# Patient Record
Sex: Female | Born: 1990
Health system: Southern US, Community
[De-identification: ages and names within clinical notes are randomized; demographics above are authoritative.]

## PROBLEM LIST (undated history)

## (undated) DIAGNOSIS — F419 Anxiety disorder, unspecified: Secondary | ICD-10-CM

## (undated) DIAGNOSIS — Z8619 Personal history of other infectious and parasitic diseases: Secondary | ICD-10-CM

## (undated) DIAGNOSIS — R51 Headache: Secondary | ICD-10-CM

## (undated) DIAGNOSIS — F329 Major depressive disorder, single episode, unspecified: Secondary | ICD-10-CM

## (undated) DIAGNOSIS — M549 Dorsalgia, unspecified: Secondary | ICD-10-CM

## (undated) DIAGNOSIS — G43909 Migraine, unspecified, not intractable, without status migrainosus: Secondary | ICD-10-CM

## (undated) DIAGNOSIS — M2550 Pain in unspecified joint: Secondary | ICD-10-CM

## (undated) DIAGNOSIS — R5383 Other fatigue: Secondary | ICD-10-CM

## (undated) DIAGNOSIS — R519 Headache, unspecified: Secondary | ICD-10-CM

## (undated) DIAGNOSIS — N289 Disorder of kidney and ureter, unspecified: Secondary | ICD-10-CM

## (undated) DIAGNOSIS — F32A Depression, unspecified: Secondary | ICD-10-CM

## (undated) HISTORY — DX: Dorsalgia, unspecified: M54.9

## (undated) HISTORY — DX: Headache, unspecified: R51.9

## (undated) HISTORY — PX: MOLE REMOVAL: SHX2046

## (undated) HISTORY — DX: Headache: R51

## (undated) HISTORY — DX: Other fatigue: R53.83

## (undated) HISTORY — DX: Anxiety disorder, unspecified: F41.9

## (undated) HISTORY — PX: TONSILLECTOMY: SUR1361

## (undated) HISTORY — DX: Depression, unspecified: F32.A

## (undated) HISTORY — DX: Pain in unspecified joint: M25.50

## (undated) HISTORY — DX: Disorder of kidney and ureter, unspecified: N28.9

## (undated) HISTORY — PX: KIDNEY STONE SURGERY: SHX686

## (undated) HISTORY — DX: Personal history of other infectious and parasitic diseases: Z86.19

---
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
## 2015-09-29 LAB — OB RESULTS CONSOLE ABO/RH: RH TYPE: POSITIVE

## 2015-09-29 LAB — OB RESULTS CONSOLE HIV ANTIBODY (ROUTINE TESTING): HIV: NONREACTIVE

## 2015-09-29 LAB — OB RESULTS CONSOLE HEPATITIS B SURFACE ANTIGEN: HEP B S AG: NEGATIVE

## 2015-09-29 LAB — OB RESULTS CONSOLE ANTIBODY SCREEN: Antibody Screen: NEGATIVE

## 2015-09-29 LAB — OB RESULTS CONSOLE RUBELLA ANTIBODY, IGM: RUBELLA: IMMUNE

## 2015-10-16 LAB — OB RESULTS CONSOLE GC/CHLAMYDIA
Chlamydia: NEGATIVE
Gonorrhea: NEGATIVE

## 2015-12-28 NOTE — L&D Delivery Note (Signed)
Delivery Note At 8:25 PM a viable female was delivered via Vaginal, Spontaneous Delivery (Presentation: ; Occiput Anterior).  APGAR: 8, 9; weight  .   Placenta status: Intact and malodorous Spontaneous.  Cord: 3 vessels with the following complications: chorioamnionitis  Cord pH: not indicated  Anesthesia: Epidural  Episiotomy: None Lacerations: Sulcus;Labial, 2nd degree perineal Suture Repair: 2.0 3.0 vicryl rapide, single figure of 8 of 4-0 to L labial extension of sulcal Est. Blood Loss (mL):  450  Mom to postpartum.  Baby to Couplet care / Skin to Skin.  Katie Faraone A. 05/13/2016, 9:01 PM

## 2016-02-17 LAB — OB RESULTS CONSOLE RPR: RPR: NONREACTIVE

## 2016-04-16 DIAGNOSIS — Z3403 Encounter for supervision of normal first pregnancy, third trimester: Secondary | ICD-10-CM | POA: Diagnosis not present

## 2016-04-16 DIAGNOSIS — Z36 Encounter for antenatal screening of mother: Secondary | ICD-10-CM | POA: Diagnosis not present

## 2016-04-16 DIAGNOSIS — Z3A36 36 weeks gestation of pregnancy: Secondary | ICD-10-CM | POA: Diagnosis not present

## 2016-04-16 DIAGNOSIS — O3663X0 Maternal care for excessive fetal growth, third trimester, not applicable or unspecified: Secondary | ICD-10-CM | POA: Diagnosis not present

## 2016-04-16 LAB — OB RESULTS CONSOLE GBS: GBS: NEGATIVE

## 2016-05-11 ENCOUNTER — Encounter (HOSPITAL_COMMUNITY): Payer: Self-pay | Admitting: *Deleted

## 2016-05-11 ENCOUNTER — Telehealth (HOSPITAL_COMMUNITY): Payer: Self-pay | Admitting: *Deleted

## 2016-05-11 NOTE — Telephone Encounter (Signed)
Preadmission screen  

## 2016-05-12 ENCOUNTER — Encounter (HOSPITAL_COMMUNITY): Payer: Self-pay | Admitting: *Deleted

## 2016-05-12 ENCOUNTER — Inpatient Hospital Stay (HOSPITAL_COMMUNITY)
Admission: AD | Admit: 2016-05-12 | Discharge: 2016-05-15 | DRG: 775 | Disposition: A | Discharge status: Home / Self Care | Payer: BLUE CROSS/BLUE SHIELD | Source: Ambulatory Visit | Attending: Obstetrics & Gynecology | Admitting: Obstetrics & Gynecology

## 2016-05-12 ENCOUNTER — Other Ambulatory Visit: Payer: Self-pay | Admitting: Obstetrics

## 2016-05-12 DIAGNOSIS — O4202 Full-term premature rupture of membranes, onset of labor within 24 hours of rupture: Secondary | ICD-10-CM | POA: Diagnosis not present

## 2016-05-12 DIAGNOSIS — Z8249 Family history of ischemic heart disease and other diseases of the circulatory system: Secondary | ICD-10-CM | POA: Diagnosis not present

## 2016-05-12 DIAGNOSIS — O411230 Chorioamnionitis, third trimester, not applicable or unspecified: Secondary | ICD-10-CM | POA: Diagnosis not present

## 2016-05-12 DIAGNOSIS — O99214 Obesity complicating childbirth: Secondary | ICD-10-CM | POA: Diagnosis present

## 2016-05-12 DIAGNOSIS — E669 Obesity, unspecified: Secondary | ICD-10-CM | POA: Diagnosis present

## 2016-05-12 DIAGNOSIS — Z6835 Body mass index (BMI) 35.0-35.9, adult: Secondary | ICD-10-CM

## 2016-05-12 DIAGNOSIS — Z3A40 40 weeks gestation of pregnancy: Secondary | ICD-10-CM | POA: Diagnosis not present

## 2016-05-12 DIAGNOSIS — O4290 Premature rupture of membranes, unspecified as to length of time between rupture and onset of labor, unspecified weeks of gestation: Secondary | ICD-10-CM | POA: Diagnosis present

## 2016-05-12 DIAGNOSIS — O3663X0 Maternal care for excessive fetal growth, third trimester, not applicable or unspecified: Secondary | ICD-10-CM | POA: Diagnosis present

## 2016-05-12 DIAGNOSIS — Z833 Family history of diabetes mellitus: Secondary | ICD-10-CM

## 2016-05-12 DIAGNOSIS — Z23 Encounter for immunization: Secondary | ICD-10-CM | POA: Diagnosis not present

## 2016-05-12 LAB — POCT FERN TEST: POCT Fern Test: POSITIVE

## 2016-05-12 MED ORDER — LIDOCAINE HCL (PF) 1 % IJ SOLN
30.0000 mL | INTRAMUSCULAR | Status: DC | PRN
  Filled 2016-05-12: qty 30

## 2016-05-12 MED ORDER — OXYCODONE-ACETAMINOPHEN 5-325 MG PO TABS: 2.0000 | ORAL_TABLET | ORAL | Status: DC | PRN

## 2016-05-12 MED ORDER — CITRIC ACID-SODIUM CITRATE 334-500 MG/5ML PO SOLN: 30.0000 mL | ORAL | Status: DC | PRN

## 2016-05-12 MED ORDER — ACETAMINOPHEN 325 MG PO TABS: 650.0000 mg | ORAL_TABLET | ORAL | Status: DC | PRN

## 2016-05-12 MED ORDER — LACTATED RINGERS IV SOLN
INTRAVENOUS | Status: DC
  Administered 2016-05-13 (×3): via INTRAVENOUS

## 2016-05-12 MED ORDER — OXYCODONE-ACETAMINOPHEN 5-325 MG PO TABS: 1.0000 | ORAL_TABLET | ORAL | Status: DC | PRN

## 2016-05-12 MED ORDER — OXYTOCIN BOLUS FROM INFUSION
500.0000 mL | INTRAVENOUS | Status: DC
  Administered 2016-05-13: 500 mL via INTRAVENOUS

## 2016-05-12 MED ORDER — LACTATED RINGERS IV SOLN
500.0000 mL | INTRAVENOUS | Status: DC | PRN
  Administered 2016-05-13 (×2): 500 mL via INTRAVENOUS

## 2016-05-12 MED ORDER — OXYTOCIN 40 UNITS IN LACTATED RINGERS INFUSION - SIMPLE MED: 2.5000 [IU]/h | INTRAVENOUS | Status: DC

## 2016-05-12 MED ORDER — ONDANSETRON HCL 4 MG/2ML IJ SOLN: 4.0000 mg | Freq: Four times a day (QID) | INTRAMUSCULAR | Status: DC | PRN

## 2016-05-12 MED ORDER — FLEET ENEMA 7-19 GM/118ML RE ENEM: 1.0000 | ENEMA | RECTAL | Status: DC | PRN

## 2016-05-12 NOTE — MAU Note (Signed)
Pt states she was seen in the MD's office this am and has been contracting consistently since noon. Now contractions are about 3 minutes.

## 2016-05-12 NOTE — Progress Notes (Signed)
Notified of pt SROM and positive fern. Will admit to labor and delivery.

## 2016-05-13 ENCOUNTER — Encounter (HOSPITAL_COMMUNITY): Payer: Self-pay

## 2016-05-13 ENCOUNTER — Inpatient Hospital Stay (HOSPITAL_COMMUNITY): Payer: BLUE CROSS/BLUE SHIELD | Admitting: Anesthesiology

## 2016-05-13 LAB — CBC
HCT: 40.1 % (ref 36.0–46.0)
HEMOGLOBIN: 13.7 g/dL (ref 12.0–15.0)
MCH: 30.9 pg (ref 26.0–34.0)
MCHC: 34.2 g/dL (ref 30.0–36.0)
MCV: 90.3 fL (ref 78.0–100.0)
PLATELETS: 184 10*3/uL (ref 150–400)
RBC: 4.44 MIL/uL (ref 3.87–5.11)
RDW: 13.1 % (ref 11.5–15.5)
WBC: 13.1 10*3/uL — ABNORMAL HIGH (ref 4.0–10.5)

## 2016-05-13 LAB — TYPE AND SCREEN
ABO/RH(D): O POS
Antibody Screen: NEGATIVE

## 2016-05-13 LAB — ABO/RH: ABO/RH(D): O POS

## 2016-05-13 MED ORDER — BUTORPHANOL TARTRATE 1 MG/ML IJ SOLN
1.0000 mg | INTRAMUSCULAR | Status: AC
  Administered 2016-05-13 (×2): 1 mg via INTRAVENOUS
  Filled 2016-05-13 (×2): qty 1

## 2016-05-13 MED ORDER — ONDANSETRON HCL 4 MG/2ML IJ SOLN: 4.0000 mg | INTRAMUSCULAR | Status: DC | PRN

## 2016-05-13 MED ORDER — PHENYLEPHRINE 40 MCG/ML (10ML) SYRINGE FOR IV PUSH (FOR BLOOD PRESSURE SUPPORT)
80.0000 ug | PREFILLED_SYRINGE | INTRAVENOUS | Status: DC | PRN
  Filled 2016-05-13: qty 5
  Filled 2016-05-13: qty 10

## 2016-05-13 MED ORDER — BISACODYL 10 MG RE SUPP: 10.0000 mg | Freq: Every day | RECTAL | Status: DC | PRN

## 2016-05-13 MED ORDER — BUTORPHANOL TARTRATE 1 MG/ML IJ SOLN
1.0000 mg | Freq: Once | INTRAMUSCULAR | Status: AC
  Administered 2016-05-13: 1 mg via INTRAVENOUS
  Filled 2016-05-13: qty 1

## 2016-05-13 MED ORDER — TETANUS-DIPHTH-ACELL PERTUSSIS 5-2.5-18.5 LF-MCG/0.5 IM SUSP: 0.5000 mL | Freq: Once | INTRAMUSCULAR | Status: DC

## 2016-05-13 MED ORDER — DIBUCAINE 1 % RE OINT: 1.0000 "application " | TOPICAL_OINTMENT | RECTAL | Status: DC | PRN

## 2016-05-13 MED ORDER — SENNOSIDES-DOCUSATE SODIUM 8.6-50 MG PO TABS
2.0000 | ORAL_TABLET | ORAL | Status: DC
  Administered 2016-05-14 – 2016-05-15 (×2): 2 via ORAL
  Filled 2016-05-13 (×2): qty 2

## 2016-05-13 MED ORDER — TERBUTALINE SULFATE 1 MG/ML IJ SOLN
0.2500 mg | Freq: Once | INTRAMUSCULAR | Status: DC | PRN
  Filled 2016-05-13: qty 1

## 2016-05-13 MED ORDER — BENZOCAINE-MENTHOL 20-0.5 % EX AERO
1.0000 "application " | INHALATION_SPRAY | CUTANEOUS | Status: DC | PRN
  Administered 2016-05-14: 1 via TOPICAL
  Filled 2016-05-13: qty 56

## 2016-05-13 MED ORDER — NALBUPHINE HCL 10 MG/ML IJ SOLN
10.0000 mg | INTRAMUSCULAR | Status: DC | PRN
  Administered 2016-05-13: 10 mg via INTRAMUSCULAR
  Filled 2016-05-13: qty 1

## 2016-05-13 MED ORDER — GENTAMICIN SULFATE 40 MG/ML IJ SOLN
1.5000 mg/kg | INTRAVENOUS | Status: DC
  Filled 2016-05-13: qty 3

## 2016-05-13 MED ORDER — PRENATAL MULTIVITAMIN CH
1.0000 | ORAL_TABLET | Freq: Every day | ORAL | Status: DC
  Administered 2016-05-14 – 2016-05-15 (×2): 1 via ORAL
  Filled 2016-05-13 (×2): qty 1

## 2016-05-13 MED ORDER — EPHEDRINE 5 MG/ML INJ
10.0000 mg | INTRAVENOUS | Status: DC | PRN
  Filled 2016-05-13: qty 2

## 2016-05-13 MED ORDER — IBUPROFEN 600 MG PO TABS
600.0000 mg | ORAL_TABLET | Freq: Four times a day (QID) | ORAL | Status: DC
  Administered 2016-05-14 – 2016-05-15 (×7): 600 mg via ORAL
  Filled 2016-05-13 (×7): qty 1

## 2016-05-13 MED ORDER — ONDANSETRON HCL 4 MG PO TABS: 4.0000 mg | ORAL_TABLET | ORAL | Status: DC | PRN

## 2016-05-13 MED ORDER — SODIUM CHLORIDE 0.9 % IV SOLN
2.0000 g | Freq: Four times a day (QID) | INTRAVENOUS | Status: DC
  Administered 2016-05-13: 2 g via INTRAVENOUS
  Filled 2016-05-13 (×2): qty 2000

## 2016-05-13 MED ORDER — FENTANYL 2.5 MCG/ML BUPIVACAINE 1/10 % EPIDURAL INFUSION (WH - ANES)
14.0000 mL/h | INTRAMUSCULAR | Status: DC | PRN
  Administered 2016-05-13 (×2): 14 mL/h via EPIDURAL
  Filled 2016-05-13: qty 125

## 2016-05-13 MED ORDER — ZOLPIDEM TARTRATE 5 MG PO TABS: 5.0000 mg | ORAL_TABLET | Freq: Every evening | ORAL | Status: DC | PRN

## 2016-05-13 MED ORDER — PHENYLEPHRINE 40 MCG/ML (10ML) SYRINGE FOR IV PUSH (FOR BLOOD PRESSURE SUPPORT)
80.0000 ug | PREFILLED_SYRINGE | INTRAVENOUS | Status: DC | PRN
  Filled 2016-05-13: qty 5

## 2016-05-13 MED ORDER — COCONUT OIL OIL
1.0000 "application " | TOPICAL_OIL | Status: DC | PRN
  Administered 2016-05-14: 1 via TOPICAL
  Filled 2016-05-13: qty 120

## 2016-05-13 MED ORDER — DIPHENHYDRAMINE HCL 50 MG/ML IJ SOLN
12.5000 mg | INTRAMUSCULAR | Status: DC | PRN
Start: 2016-05-13 — End: 2016-05-13

## 2016-05-13 MED ORDER — LACTATED RINGERS IV SOLN
500.0000 mL | Freq: Once | INTRAVENOUS | Status: AC
  Administered 2016-05-13: 500 mL via INTRAVENOUS

## 2016-05-13 MED ORDER — GENTAMICIN SULFATE 40 MG/ML IJ SOLN
1.5000 mg/kg | Freq: Three times a day (TID) | INTRAVENOUS | Status: DC
  Administered 2016-05-13: 120 mg via INTRAVENOUS
  Filled 2016-05-13: qty 3

## 2016-05-13 MED ORDER — LIDOCAINE HCL (PF) 1 % IJ SOLN
INTRAMUSCULAR | Status: DC | PRN
  Administered 2016-05-13 (×2): 4 mL

## 2016-05-13 MED ORDER — NALBUPHINE HCL 10 MG/ML IJ SOLN
10.0000 mg | INTRAMUSCULAR | Status: DC | PRN
  Administered 2016-05-13: 10 mg via INTRAVENOUS
  Filled 2016-05-13: qty 1

## 2016-05-13 MED ORDER — ACETAMINOPHEN 325 MG PO TABS
650.0000 mg | ORAL_TABLET | ORAL | Status: DC | PRN
  Administered 2016-05-14 (×2): 650 mg via ORAL
  Filled 2016-05-13 (×2): qty 2

## 2016-05-13 MED ORDER — SIMETHICONE 80 MG PO CHEW
80.0000 mg | CHEWABLE_TABLET | ORAL | Status: DC | PRN
  Administered 2016-05-14: 80 mg via ORAL

## 2016-05-13 MED ORDER — DEXTROSE 5 % IV SOLN
1.5000 mg/kg | Freq: Three times a day (TID) | INTRAVENOUS | Status: DC
  Filled 2016-05-13: qty 3.75

## 2016-05-13 MED ORDER — WITCH HAZEL-GLYCERIN EX PADS
1.0000 "application " | MEDICATED_PAD | CUTANEOUS | Status: DC | PRN
  Administered 2016-05-14: 1 via TOPICAL

## 2016-05-13 MED ORDER — DIPHENHYDRAMINE HCL 25 MG PO CAPS: 25.0000 mg | ORAL_CAPSULE | Freq: Four times a day (QID) | ORAL | Status: DC | PRN

## 2016-05-13 MED ORDER — OXYTOCIN 40 UNITS IN LACTATED RINGERS INFUSION - SIMPLE MED
1.0000 m[IU]/min | INTRAVENOUS | Status: DC
  Administered 2016-05-13: 1 m[IU]/min via INTRAVENOUS
  Filled 2016-05-13: qty 1000

## 2016-05-13 MED ORDER — FLEET ENEMA 7-19 GM/118ML RE ENEM: 1.0000 | ENEMA | Freq: Every day | RECTAL | Status: DC | PRN

## 2016-05-13 NOTE — Anesthesia Preprocedure Evaluation (Addendum)
Anesthesia Evaluation  Patient identified by MRN, date of birth, ID band Patient awake    Reviewed: Allergy & Precautions, NPO status , Patient's Chart, lab work & pertinent test results  History of Anesthesia Complications Negative for: history of anesthetic complications  Airway Mallampati: II  TM Distance: >3 FB Neck ROM: Full    Dental no notable dental hx. (+) Dental Advisory Given   Pulmonary neg pulmonary ROS,    Pulmonary exam normal breath sounds clear to auscultation       Cardiovascular negative cardio ROS Normal cardiovascular exam Rhythm:Regular Rate:Normal     Neuro/Psych  Headaches, negative psych ROS   GI/Hepatic negative GI ROS, Neg liver ROS,   Endo/Other  obesity  Renal/GU negative Renal ROS  negative genitourinary   Musculoskeletal negative musculoskeletal ROS (+)   Abdominal   Peds negative pediatric ROS (+)  Hematology negative hematology ROS (+)   Anesthesia Other Findings   Reproductive/Obstetrics (+) Pregnancy                             Anesthesia Physical Anesthesia Plan  ASA: II  Anesthesia Plan: Epidural   Post-op Pain Management:    Induction:   Airway Management Planned:   Additional Equipment:   Intra-op Plan:   Post-operative Plan:   Informed Consent: I have reviewed the patients History and Physical, chart, labs and discussed the procedure including the risks, benefits and alternatives for the proposed anesthesia with the patient or authorized representative who has indicated his/her understanding and acceptance.   Dental advisory given  Plan Discussed with: CRNA  Anesthesia Plan Comments:         Anesthesia Quick Evaluation  

## 2016-05-13 NOTE — Progress Notes (Signed)
S: Doing well, no complaints, pain poorly controlled with IV meds, planning epidural now  O: BP 136/70 mmHg  Pulse 70  Temp(Src) 98.2 F (36.8 C) (Oral)  Resp 20  Ht 5\' 7"  (1.702 m)  Wt 102.967 kg (227 lb)  BMI 35.55 kg/m2  SpO2 99%  LMP 08/04/2015   FHT:  FHR: 140s bpm, variability: moderate,  accelerations:  Present,  decelerations:  Absent UC:   regular, every 4 minutes SVE:   Dilation: 4 Effacement (%): 70 Station: 0 Exam by:: J.Thornton, RN    A / P:  25 y.o.  Obstetric History   G1   P0   T0   P0   A0   TAB0   SAB0   E0   M0   L0    at 2344w3d Induction of labor due to PROM,  progressing well on pitocin  Fetal Wellbeing:  Category I Pain Control:  Epidural  Anticipated MOD:  NSVD  Carder Yin A. 05/13/2016, 1:10 PM

## 2016-05-13 NOTE — Progress Notes (Signed)
S: Doing well, no complaints, pain  controlled with epiduralt though feels intense pelvic pressure with contractions.  O: BP 140/82 mmHg  Pulse 127  Temp(Src) 98.9 F (37.2 C) (Oral)  Resp 18  Ht 5\' 7"  (1.702 m)  Wt 102.967 kg (227 lb)  BMI 35.55 kg/m2  SpO2 99%  LMP 08/04/2015   FHT:  FHR: 180s bpm, variability: moderate,  accelerations:  Present,  decelerations:  Absent UC:   regular, every 3 minutes SVE:   Dilation: 10 Effacement (%): 100 Station: +2 Exam by:: J.Thornton, RN  Unable to accurately tell position, suspect OP. No molding/ caput Thick meconium  A / P:  25 y.o.  Obstetric History   G1   P0   T0   P0   A0   TAB0   SAB0   E0   M0   L0    at 69108w3d good progress on pitocin after SROM, LGA, pushing x 1'45' min. continue pushing. No maternal fever by oral temp but fetal tachycardia, meconium and pt is palpably warm in vagina- suspect pending chorio and will start abx.  Fetal Wellbeing:  Category II Pain Control:  Epidural  Anticipated MOD:  NSVD  Kendra Baird A. 05/13/2016, 7:57 PM

## 2016-05-13 NOTE — H&P (Signed)
Kendra CellarMeghan Baird is a 25 y.o. female presenting for contractions and is noted to have ruptured membranes since around 3 pm. UCs getting stronger since admission. GBS(-).  PNCare uncomplicated, 38 lbs wt gain, EFW 7'6" at 89% and AC at 96 % at 36 wks but EFW at 77% today. QUAD, sono normal. SocHx,- her father died when she was 14 wks, and her husband had open heart surgery for aortic aneurysm at 32 weeks.   History OB History    Gravida Para Term Preterm AB TAB SAB Ectopic Multiple Living   1              Past Medical History  Diagnosis Date  . Hx of varicella   . Headache    Past Surgical History  Procedure Laterality Date  . Tonsillectomy     Family History: family history includes Cancer in her father and mother; Diabetes in her father; Heart disease in her paternal grandfather; Hypertension in her father; Varicose Veins in her brother and mother. Social History:  reports that she has never smoked. She does not have any smokeless tobacco history on file. She reports that she does not drink alcohol or use illicit drugs.   Prenatal Transfer Tool  Maternal Diabetes: No Genetic Screening: Normal QUAD Maternal Ultrasounds/Referrals: Normal Fetal Ultrasounds or other Referrals:  None Maternal Substance Abuse:  No Significant Maternal Medications:  None Significant Maternal Lab Results:  Lab values include: Group B Strep negative Other Comments:  None  ROS neg  Dilation: Fingertip Effacement (%): 50 Station: -3 Exam by:: C Brewer RN Blood pressure 148/83, pulse 86, temperature 98.2 F (36.8 C), temperature source Oral, resp. rate 18, height 5\' 7"  (1.702 m), weight 227 lb (102.967 kg), last menstrual period 08/04/2015, SpO2 99 %. Exam Physical Exam  Physical exam:  A&O x 3, no acute distress. Pleasant HEENT neg, no thyromegaly Lungs CTA bilat CV RRR, S1S2 normal Abdo soft, non tender, non acute Extr no edema/ tenderness Pelvic above FHT  150s/ + accels/ no decels/ mod variab-  category I Toco q 2-3 min, spontaneous   Prenatal labs: ABO, Rh: O/Positive/-- (10/03 0000) Antibody: Negative (10/03 0000) Rubella: Immune (10/03 0000) RPR: Nonreactive (02/21 0000)  HBsAg: Negative (10/03 0000)  HIV: Non-reactive (10/03 0000)  GBS: Negative (04/21 0000)   Assessment/Plan: 25 yo G1 at 40.3 wks, early labor, SROM. Add pitocin if UCs space out. GBS(-). EFW 8.1/2 lbs. Planning vaginal delivery.    Holmes Hays R 05/13/2016, 12:30 AM

## 2016-05-13 NOTE — Anesthesia Procedure Notes (Signed)
Epidural Patient location during procedure: OB  Staffing Anesthesiologist: Pura Picinich Performed by: anesthesiologist   Preanesthetic Checklist Completed: patient identified, site marked, surgical consent, pre-op evaluation, timeout performed, IV checked, risks and benefits discussed and monitors and equipment checked  Epidural Patient position: sitting Prep: site prepped and draped and DuraPrep Patient monitoring: continuous pulse ox and blood pressure Approach: midline Location: L3-L4 Injection technique: LOR saline  Needle:  Needle type: Tuohy  Needle gauge: 17 G Needle length: 9 cm and 9 Needle insertion depth: 6 cm Catheter type: closed end flexible Catheter size: 19 Gauge Catheter at skin depth: 10 cm Test dose: negative  Assessment Events: blood not aspirated, injection not painful, no injection resistance, negative IV test and no paresthesia  Additional Notes Patient identified. Risks/Benefits/Options discussed with patient including but not limited to bleeding, infection, nerve damage, paralysis, failed block, incomplete pain control, headache, blood pressure changes, nausea, vomiting, reactions to medication both or allergic, itching and postpartum back pain. Confirmed with bedside nurse the patient's most recent platelet count. Confirmed with patient that they are not currently taking any anticoagulation, have any bleeding history or any family history of bleeding disorders. Patient expressed understanding and wished to proceed. All questions were answered. Sterile technique was used throughout the entire procedure. Please see nursing notes for vital signs. Test dose was given through epidural catheter and negative prior to continuing to dose epidural or start infusion. Warning signs of high block given to the patient including shortness of breath, tingling/numbness in hands, complete motor block, or any concerning symptoms with instructions to call for help. Patient was  given instructions on fall risk and not to get out of bed. All questions and concerns addressed with instructions to call with any issues or inadequate analgesia.    

## 2016-05-13 NOTE — Anesthesia Pain Management Evaluation Note (Signed)
  CRNA Pain Management Visit Note  Patient: Kendra Baird, 25 y.o., female  "Hello I am a member of the anesthesia team at [facility]. We have an anesthesia team available at all times to provide care throughout the hospital, including epidural management and anesthesia for C-section. I don't know your plan for the delivery whether it a natural birth, water birth, IV sedation, nitrous supplementation, doula or epidural, but we want to meet your pain goals."   1.Was your pain managed to your expectations on prior hospitalizations?   No prior hospitalizations  2.What is your expectation for pain management during this hospitalization?     IV pain meds  3.How can we help you reach that goal? **informed of pain treatment options including epidural*  Record the patient's initial score and the patient's pain goal.   Pain: 7  Pain Goal: 9 [facility] wants you to be able to say your pain was always managed very well.  Edison PaceWILKERSON,Tallula Grindle 05/13/2016

## 2016-05-14 ENCOUNTER — Encounter (HOSPITAL_COMMUNITY): Payer: Self-pay | Admitting: *Deleted

## 2016-05-14 LAB — CBC
HEMATOCRIT: 33.8 % — AB (ref 36.0–46.0)
Hemoglobin: 11.5 g/dL — ABNORMAL LOW (ref 12.0–15.0)
MCH: 30.3 pg (ref 26.0–34.0)
MCHC: 34 g/dL (ref 30.0–36.0)
MCV: 89.2 fL (ref 78.0–100.0)
PLATELETS: 172 10*3/uL (ref 150–400)
RBC: 3.79 MIL/uL — AB (ref 3.87–5.11)
RDW: 13.2 % (ref 11.5–15.5)
WBC: 22.8 10*3/uL — ABNORMAL HIGH (ref 4.0–10.5)

## 2016-05-14 LAB — RPR: RPR Ser Ql: NONREACTIVE

## 2016-05-14 NOTE — Lactation Note (Signed)
This note was copied from a baby's chart. Lactation Consultation Note  Patient Name: Boy [place] CellarMeghan Colonna UEAVW'UToday's Date: 05/14/2016 Reason for consult: Initial assessment  Baby 18 hours old, and per mom and dad baby had meconium  Stool in amniotic fluid. One wet diaper. Baby has been to the breast several times and attempts.  Per mom was shown how to hand express by [facility].  Baby stirring when LC in the room , dad checked diaper and clean.  Dad placed baby skin to skin in cross cradle. Mom latched and LC eased down chin to obtain depth , upper lip  Flipped to flanged position. Intermittent swallows noted, increased with breast compressions.  LC reviewed hand expression with several drops before latch.  LC reviewed steps for latching - breast massage, hand express, breast compressions until the baby is swallowing  And mom comfortable with latch.  Mother informed of post-discharge support and given phone number to the lactation department, including services for  phone call assistance; out-patient appointments; and breastfeeding support group. List of other breastfeeding resources  in the community given in the handout. Encouraged mother to call for problems or concerns related to breastfeeding.   Maternal Data Has patient been taught Hand Expression?: Yes  Feeding Feeding Type: Breast Fed (laid back position ) Length of feed: 15 min (mutliply swallows, increased with breast compressions, )  LATCH Score/Interventions Latch: Grasps breast easily, tongue down, lips flanged, rhythmical sucking. Intervention(s): Adjust position;Assist with latch;Breast massage;Breast compression  Audible Swallowing: Spontaneous and intermittent  Type of Nipple: Everted at rest and after stimulation  Comfort (Breast/Nipple): Soft / non-tender     Hold (Positioning): Assistance needed to correctly position infant at breast and maintain latch. Intervention(s): Breastfeeding basics reviewed;Support  Pillows;Position options;Skin to skin  LATCH Score: 9  Lactation Tools Discussed/Used WIC Program: No   Consult Status Consult Status: Follow-up Date: 05/15/16 Follow-up type: In-patient    Kathrin Greathouseorio, Shamere Campas Ann 05/14/2016, 2:47 PM

## 2016-05-14 NOTE — Progress Notes (Signed)
PPD 1 SVD with 2nd degree & sulcus repair  S:  Reports feeling well             Tolerating po/ No nausea or vomiting             Bleeding is moderate             Pain controlled with motrin             Up ad lib / ambulatory / voiding QS  Newborn breast feeding O:               VS: BP 115/66 mmHg  Pulse 82  Temp(Src) 98.2 F (36.8 C) (Oral)  Resp 20  Ht 5\' 7"  (1.702 m)  Wt 102.967 kg (227 lb)  BMI 35.55 kg/m2  SpO2 99%  LMP 08/04/2015  Breastfeeding? Unknown   LABS:              Recent Labs  05/12/16 2345 05/14/16 0540  WBC 13.1* 22.8*  HGB 13.7 11.5*  PLT 184 172               Blood type: --/--/O POS, O POS (05/17 2345)  Rubella: Immune (10/03 0000)                     I&O: Intake/Output      05/18 0701 - 05/19 0700 05/19 0701 - 05/20 0700   Urine (mL/kg/hr) 2700 (1.1)    Blood 450 (0.2)    Total Output 3150     Net -3150                     Physical Exam:             Alert and oriented X3  Lungs: Clear and unlabored  Heart: regular rate and rhythm / no mumurs  Abdomen: soft, non-tender, non-distended              Fundus: firm, non-tender, U-1  Perineum: mild edema  Lochia: light  Extremities: no edema, no calf pain or tenderness  A: PPD # 1   Doing well - stable status  P: Routine post partum orders    Marlinda MikeBAILEY, Vincenza Dail CNM, MSN, [facility] 05/14/2016, 8:22 AM

## 2016-05-14 NOTE — Anesthesia Postprocedure Evaluation (Signed)
Anesthesia Post Note  Patient: Kendra Baird  Procedure(s) Performed: * No procedures listed *  Patient location during evaluation: Mother Baby Anesthesia Type: Epidural Level of consciousness: oriented and awake and alert Pain management: pain level controlled Vital Signs Assessment: post-procedure vital signs reviewed and stable Respiratory status: spontaneous breathing and nonlabored ventilation Cardiovascular status: stable Postop Assessment: epidural receding, patient able to bend at knees, no signs of nausea or vomiting and adequate PO intake Anesthetic complications: no     Last Vitals:  Filed Vitals:   05/14/16 0400 05/14/16 0556  BP: 108/59 115/66  Pulse: 70 82  Temp: 36.5 C 36.8 C  Resp: 16 20    Last Pain:  Filed Vitals:   05/14/16 0833  PainSc: 0-No pain   Pain Goal: Patients Stated Pain Goal: 4 (05/13/16 0000)               Laban EmperorMalinova,Kartier Bennison Hristova

## 2016-05-15 MED ORDER — IBUPROFEN 600 MG PO TABS: 600.0000 mg | ORAL_TABLET | Freq: Four times a day (QID) | ORAL | Status: DC

## 2016-05-15 NOTE — Discharge Planning (Signed)
TDaP given in MD office per patient and report, Rubella immune, GBS negative, RPR non-reactive, all other labs and vaccinations WNL and up to date.  Patient afebrile greater than 24 hours and educated regarding signs and symptoms of infection.  Discharge teaching completed using the Baby and Me book and teach back.  No questions or concerns voiced.  Patient able to state follow-up needs, when to call MD for self and baby, and home care needs of self and baby.

## 2016-05-15 NOTE — Progress Notes (Signed)
PPD 2 SVD  S:  Reports feeling well - ready to go home             Tolerating po/ No nausea or vomiting             Bleeding is light             Pain controlled with motrin             Up ad lib / ambulatory / voiding QS  Newborn breast feeding   O:               VS: BP 121/75 mmHg  Pulse 77  Temp(Src) 98.1 F (36.7 C) (Oral)  Resp 18  Ht 5\' 7"  (1.702 m)  Wt 102.967 kg (227 lb)  BMI 35.55 kg/m2  SpO2 99%  LMP 08/04/2015  Breastfeeding? Unknown   LABS:              Recent Labs  05/12/16 2345 05/14/16 0540  WBC 13.1* 22.8*  HGB 13.7 11.5*  PLT 184 172               Blood type: --/--/O POS, O POS (05/17 2345)  Rubella: Immune (10/03 0000)                     Physical Exam:             Alert and oriented X3  Abdomen: soft, non-tender, non-distended              Fundus: firm, non-tender, U-1  Perineum: no edema  Lochia: light  Extremities: no edema, no calf pain or tenderness    A: PPD # 2   Doing well - stable status  P: Routine post partum orders  DC home  Marlinda MikeBAILEY, TANYA CNM, MSN, [facility] 05/15/2016, 10:48 AM

## 2016-05-15 NOTE — Discharge Summary (Signed)
Obstetric Discharge Summary Reason for Admission: onset of labor Prenatal Procedures: none Intrapartum Procedures: spontaneous vaginal delivery and epidural Postpartum Procedures: none Complications-Operative and Postpartum: 2nd degree perineal laceration HEMOGLOBIN  Date Value Ref Range Status  05/14/2016 11.5* 12.0 - 15.0 g/dL Final   HCT  Date Value Ref Range Status  05/14/2016 33.8* 36.0 - 46.0 % Final    Physical Exam:  General: alert, cooperative and no distress Lochia: appropriate Uterine Fundus: firm Incision: healing well DVT Evaluation: No evidence of DVT seen on physical exam.  Discharge Diagnoses: Term Pregnancy-delivered  Discharge Information: Date: 05/15/2016 Activity: pelvic rest Diet: routine Medications: PNV and Ibuprofen Condition: stable Instructions: refer to practice specific booklet Discharge to: home Follow-up Information    [facility] Schedule an appointment as soon as possible for a visit in 6 weeks.   Specialty:  Obstetrics and Gynecology   Contact information:   [address] [place] [id number] [phone]       Newborn Data: Live born female  Birth Weight: 8 lb 5 oz (3770 g) APGAR: 8, 9  Home with mother.  Marlinda MikeBAILEY, TANYA 05/15/2016, 10:50 AM

## 2016-05-15 NOTE — Lactation Note (Signed)
This note was copied from a baby's chart. Lactation Consultation Note  Patient Name: Boy [place] CellarMeghan Ray[place] ZOXWR'UToday's Date: 05/15/2016 Reason for consult: Follow-up assessment  Baby is 4938 hours old and has been consistent at the breast and per mom and recently breast fed for 25 mins.  Per mom the baby cluster fed last night.  Per mom nipples sensitive - LC encouraged applying EBM to nipples liberally.  Sore nipple and engorgement prevention and tx reviewed.  Per mom has DEBP at home.  LC reviewed doc flow sheets , WNL for D/C . Mother informed of post-discharge support and given phone number to the lactation department, including services  for phone call assistance; out-patient appointments; and breastfeeding support group. List of other breastfeeding  resources in the community given in the handout. Encouraged mother to call for problems or concerns related to  breastfeeding.    Maternal Data    Feeding Feeding Type:  (per mom baby recently breast fed ) Length of feed: 25 min (per mom )  LATCH Score/Interventions                Intervention(s): Breastfeeding basics reviewed     Lactation Tools Discussed/Used WIC Program: No   Consult Status Consult Status: Complete Date: 05/15/16    Kathrin Greathouseorio, Alaijah Gibler Ann 05/15/2016, 11:14 AM

## 2016-05-19 ENCOUNTER — Inpatient Hospital Stay (HOSPITAL_COMMUNITY): Admission: RE | Admit: 2016-05-19 | Payer: BLUE CROSS/BLUE SHIELD | Source: Ambulatory Visit

## 2016-08-16 DIAGNOSIS — M9902 Segmental and somatic dysfunction of thoracic region: Secondary | ICD-10-CM | POA: Diagnosis not present

## 2016-08-16 DIAGNOSIS — M9901 Segmental and somatic dysfunction of cervical region: Secondary | ICD-10-CM | POA: Diagnosis not present

## 2016-08-16 DIAGNOSIS — M791 Myalgia: Secondary | ICD-10-CM | POA: Diagnosis not present

## 2016-08-23 DIAGNOSIS — M791 Myalgia: Secondary | ICD-10-CM | POA: Diagnosis not present

## 2016-08-23 DIAGNOSIS — M9902 Segmental and somatic dysfunction of thoracic region: Secondary | ICD-10-CM | POA: Diagnosis not present

## 2016-08-23 DIAGNOSIS — M9901 Segmental and somatic dysfunction of cervical region: Secondary | ICD-10-CM | POA: Diagnosis not present

## 2016-08-25 DIAGNOSIS — M9901 Segmental and somatic dysfunction of cervical region: Secondary | ICD-10-CM | POA: Diagnosis not present

## 2016-08-25 DIAGNOSIS — M791 Myalgia: Secondary | ICD-10-CM | POA: Diagnosis not present

## 2016-08-25 DIAGNOSIS — M9902 Segmental and somatic dysfunction of thoracic region: Secondary | ICD-10-CM | POA: Diagnosis not present

## 2016-08-31 DIAGNOSIS — M9902 Segmental and somatic dysfunction of thoracic region: Secondary | ICD-10-CM | POA: Diagnosis not present

## 2016-08-31 DIAGNOSIS — M9901 Segmental and somatic dysfunction of cervical region: Secondary | ICD-10-CM | POA: Diagnosis not present

## 2016-08-31 DIAGNOSIS — M791 Myalgia: Secondary | ICD-10-CM | POA: Diagnosis not present

## 2016-09-03 DIAGNOSIS — M791 Myalgia: Secondary | ICD-10-CM | POA: Diagnosis not present

## 2016-09-03 DIAGNOSIS — M9901 Segmental and somatic dysfunction of cervical region: Secondary | ICD-10-CM | POA: Diagnosis not present

## 2016-09-03 DIAGNOSIS — M9902 Segmental and somatic dysfunction of thoracic region: Secondary | ICD-10-CM | POA: Diagnosis not present

## 2016-09-06 DIAGNOSIS — M791 Myalgia: Secondary | ICD-10-CM | POA: Diagnosis not present

## 2016-09-06 DIAGNOSIS — M9902 Segmental and somatic dysfunction of thoracic region: Secondary | ICD-10-CM | POA: Diagnosis not present

## 2016-09-06 DIAGNOSIS — M9901 Segmental and somatic dysfunction of cervical region: Secondary | ICD-10-CM | POA: Diagnosis not present

## 2016-09-07 DIAGNOSIS — M791 Myalgia: Secondary | ICD-10-CM | POA: Diagnosis not present

## 2016-09-07 DIAGNOSIS — M9902 Segmental and somatic dysfunction of thoracic region: Secondary | ICD-10-CM | POA: Diagnosis not present

## 2016-09-07 DIAGNOSIS — M9901 Segmental and somatic dysfunction of cervical region: Secondary | ICD-10-CM | POA: Diagnosis not present

## 2016-09-13 DIAGNOSIS — M791 Myalgia: Secondary | ICD-10-CM | POA: Diagnosis not present

## 2016-09-13 DIAGNOSIS — M9902 Segmental and somatic dysfunction of thoracic region: Secondary | ICD-10-CM | POA: Diagnosis not present

## 2016-09-13 DIAGNOSIS — M9901 Segmental and somatic dysfunction of cervical region: Secondary | ICD-10-CM | POA: Diagnosis not present

## 2016-09-15 DIAGNOSIS — M9901 Segmental and somatic dysfunction of cervical region: Secondary | ICD-10-CM | POA: Diagnosis not present

## 2016-09-15 DIAGNOSIS — M9902 Segmental and somatic dysfunction of thoracic region: Secondary | ICD-10-CM | POA: Diagnosis not present

## 2016-09-15 DIAGNOSIS — M791 Myalgia: Secondary | ICD-10-CM | POA: Diagnosis not present

## 2016-09-20 DIAGNOSIS — M9901 Segmental and somatic dysfunction of cervical region: Secondary | ICD-10-CM | POA: Diagnosis not present

## 2016-09-20 DIAGNOSIS — M791 Myalgia: Secondary | ICD-10-CM | POA: Diagnosis not present

## 2016-09-20 DIAGNOSIS — M9902 Segmental and somatic dysfunction of thoracic region: Secondary | ICD-10-CM | POA: Diagnosis not present

## 2016-10-06 DIAGNOSIS — M9901 Segmental and somatic dysfunction of cervical region: Secondary | ICD-10-CM | POA: Diagnosis not present

## 2016-10-06 DIAGNOSIS — M9902 Segmental and somatic dysfunction of thoracic region: Secondary | ICD-10-CM | POA: Diagnosis not present

## 2016-10-06 DIAGNOSIS — M791 Myalgia: Secondary | ICD-10-CM | POA: Diagnosis not present

## 2016-10-11 DIAGNOSIS — M9901 Segmental and somatic dysfunction of cervical region: Secondary | ICD-10-CM | POA: Diagnosis not present

## 2016-10-11 DIAGNOSIS — M9902 Segmental and somatic dysfunction of thoracic region: Secondary | ICD-10-CM | POA: Diagnosis not present

## 2016-10-11 DIAGNOSIS — M791 Myalgia: Secondary | ICD-10-CM | POA: Diagnosis not present

## 2016-10-14 DIAGNOSIS — M791 Myalgia: Secondary | ICD-10-CM | POA: Diagnosis not present

## 2016-10-14 DIAGNOSIS — M9902 Segmental and somatic dysfunction of thoracic region: Secondary | ICD-10-CM | POA: Diagnosis not present

## 2016-10-14 DIAGNOSIS — M9901 Segmental and somatic dysfunction of cervical region: Secondary | ICD-10-CM | POA: Diagnosis not present

## 2016-10-20 DIAGNOSIS — M9901 Segmental and somatic dysfunction of cervical region: Secondary | ICD-10-CM | POA: Diagnosis not present

## 2016-10-20 DIAGNOSIS — M791 Myalgia: Secondary | ICD-10-CM | POA: Diagnosis not present

## 2016-10-20 DIAGNOSIS — M9902 Segmental and somatic dysfunction of thoracic region: Secondary | ICD-10-CM | POA: Diagnosis not present

## 2016-10-25 DIAGNOSIS — M791 Myalgia: Secondary | ICD-10-CM | POA: Diagnosis not present

## 2016-10-25 DIAGNOSIS — M9902 Segmental and somatic dysfunction of thoracic region: Secondary | ICD-10-CM | POA: Diagnosis not present

## 2016-10-25 DIAGNOSIS — M9901 Segmental and somatic dysfunction of cervical region: Secondary | ICD-10-CM | POA: Diagnosis not present

## 2016-11-08 DIAGNOSIS — M9902 Segmental and somatic dysfunction of thoracic region: Secondary | ICD-10-CM | POA: Diagnosis not present

## 2016-11-08 DIAGNOSIS — M791 Myalgia: Secondary | ICD-10-CM | POA: Diagnosis not present

## 2016-11-08 DIAGNOSIS — M9901 Segmental and somatic dysfunction of cervical region: Secondary | ICD-10-CM | POA: Diagnosis not present

## 2017-01-31 DIAGNOSIS — T1592XA Foreign body on external eye, part unspecified, left eye, initial encounter: Secondary | ICD-10-CM | POA: Diagnosis not present

## 2017-01-31 DIAGNOSIS — R252 Cramp and spasm: Secondary | ICD-10-CM | POA: Diagnosis not present

## 2017-04-27 DIAGNOSIS — J0140 Acute pansinusitis, unspecified: Secondary | ICD-10-CM | POA: Diagnosis not present

## 2017-04-27 DIAGNOSIS — R0982 Postnasal drip: Secondary | ICD-10-CM | POA: Diagnosis not present

## 2017-09-30 DIAGNOSIS — Z01419 Encounter for gynecological examination (general) (routine) without abnormal findings: Secondary | ICD-10-CM | POA: Diagnosis not present

## 2017-09-30 DIAGNOSIS — Z6834 Body mass index (BMI) 34.0-34.9, adult: Secondary | ICD-10-CM | POA: Diagnosis not present

## 2017-09-30 DIAGNOSIS — Z23 Encounter for immunization: Secondary | ICD-10-CM | POA: Diagnosis not present

## 2018-10-04 ENCOUNTER — Encounter: Payer: Self-pay | Admitting: Emergency Medicine

## 2018-10-04 ENCOUNTER — Emergency Department (HOSPITAL_BASED_OUTPATIENT_CLINIC_OR_DEPARTMENT_OTHER)
Admission: EM | Admit: 2018-10-04 | Discharge: 2018-10-04 | Disposition: A | Discharge status: Home / Self Care | Payer: No Typology Code available for payment source | Attending: Emergency Medicine | Admitting: Emergency Medicine

## 2018-10-04 ENCOUNTER — Emergency Department (HOSPITAL_BASED_OUTPATIENT_CLINIC_OR_DEPARTMENT_OTHER): Payer: No Typology Code available for payment source

## 2018-10-04 DIAGNOSIS — R51 Headache: Secondary | ICD-10-CM | POA: Diagnosis not present

## 2018-10-04 DIAGNOSIS — R55 Syncope and collapse: Secondary | ICD-10-CM | POA: Diagnosis not present

## 2018-10-04 DIAGNOSIS — R519 Headache, unspecified: Secondary | ICD-10-CM

## 2018-10-04 HISTORY — DX: Migraine, unspecified, not intractable, without status migrainosus: G43.909

## 2018-10-04 LAB — PREGNANCY, URINE: Preg Test, Ur: NEGATIVE

## 2018-10-04 LAB — URINALYSIS, ROUTINE W REFLEX MICROSCOPIC
BILIRUBIN URINE: NEGATIVE
GLUCOSE, UA: NEGATIVE mg/dL
KETONES UR: NEGATIVE mg/dL
Leukocytes, UA: NEGATIVE
NITRITE: NEGATIVE
PH: 7 (ref 5.0–8.0)
Protein, ur: NEGATIVE mg/dL
Specific Gravity, Urine: 1.015 (ref 1.005–1.030)

## 2018-10-04 LAB — BASIC METABOLIC PANEL
Anion gap: 6 (ref 5–15)
BUN: 12 mg/dL (ref 6–20)
CHLORIDE: 105 mmol/L (ref 98–111)
CO2: 26 mmol/L (ref 22–32)
CREATININE: 0.81 mg/dL (ref 0.44–1.00)
Calcium: 8.8 mg/dL — ABNORMAL LOW (ref 8.9–10.3)
GFR calc Af Amer: >60 mL/min (ref >60)
GFR calc non Af Amer: >60 mL/min (ref >60)
Glucose, Bld: 95 mg/dL (ref 70–99)
POTASSIUM: 4.7 mmol/L (ref 3.5–5.1)
Sodium: 137 mmol/L (ref 135–145)

## 2018-10-04 LAB — CBC WITH DIFFERENTIAL/PLATELET
ABS IMMATURE GRANULOCYTES: 0.01 10*3/uL (ref 0.00–0.07)
Basophils Absolute: 0 10*3/uL (ref 0.0–0.1)
Basophils Relative: 0 %
EOS PCT: 1 %
Eosinophils Absolute: 0.1 10*3/uL (ref 0.0–0.5)
HEMATOCRIT: 43.7 % (ref 36.0–46.0)
HEMOGLOBIN: 14.6 g/dL (ref 12.0–15.0)
Immature Granulocytes: 0 %
LYMPHS PCT: 36 %
Lymphs Abs: 2 10*3/uL (ref 0.7–4.0)
MCH: 29.9 pg (ref 26.0–34.0)
MCHC: 33.4 g/dL (ref 30.0–36.0)
MCV: 89.5 fL (ref 80.0–100.0)
MONOS PCT: 7 %
Monocytes Absolute: 0.4 10*3/uL (ref 0.1–1.0)
Neutro Abs: 3 10*3/uL (ref 1.7–7.7)
Neutrophils Relative %: 56 %
Platelets: 207 10*3/uL (ref 150–400)
RBC: 4.88 MIL/uL (ref 3.87–5.11)
RDW: 11.7 % (ref 11.5–15.5)
WBC: 5.5 10*3/uL (ref 4.0–10.5)
nRBC: 0 % (ref 0.0–0.2)

## 2018-10-04 LAB — URINALYSIS, MICROSCOPIC (REFLEX)

## 2018-10-04 MED ORDER — SODIUM CHLORIDE 0.9 % IV BOLUS
1000.0000 mL | Freq: Once | INTRAVENOUS | Status: AC
  Administered 2018-10-04: 1000 mL via INTRAVENOUS

## 2018-10-04 MED ORDER — METOCLOPRAMIDE HCL 5 MG/ML IJ SOLN
10.0000 mg | Freq: Once | INTRAMUSCULAR | Status: AC
  Administered 2018-10-04: 10 mg via INTRAVENOUS
  Filled 2018-10-04: qty 2

## 2018-10-04 MED ORDER — DIPHENHYDRAMINE HCL 50 MG/ML IJ SOLN
25.0000 mg | Freq: Once | INTRAMUSCULAR | Status: AC
Start: 2018-10-04 — End: 2018-10-04
  Administered 2018-10-04: 25 mg via INTRAVENOUS
  Filled 2018-10-04: qty 1

## 2018-10-04 NOTE — ED Notes (Signed)
Blood redraw due to hemolysis

## 2018-10-04 NOTE — ED Notes (Signed)
Pt/family verbalized understanding of discharge instructions.   

## 2018-10-04 NOTE — ED Triage Notes (Signed)
Pt c/o migraine headache x4 days, states in the shower had her eyes closed and fell,?syncope hitting her head.

## 2018-10-04 NOTE — ED Provider Notes (Signed)
[facility] EMERGENCY DEPARTMENT Provider Note   CSN: [id number] Arrival date & time: 10/04/18  4098     History   Chief Complaint Chief Complaint  Patient presents with  . Migraine    HPI Kendra Baird is a 27 y.o. female.  Patient with history of migraine headaches presents the emergency department today with complaint of headache and possible syncopal episode.  Patient states that she has had her typical migraine symptoms over the past 4 days, waxing and waning.  She has been taking Excedrin Migraine with some temporary improvement.  She has had pain down the back of her head and neck into her shoulders which is typical.  She has had light sensitivity and sound sensitivity.  No vomiting.  Last night patient had an episode where she lowered herself to the ground and then passed out.  She did not injure herself at that time.  This morning she was in the shower and again had lightheadedness and lost consciousness.  She was found in a seated position by her husband.  He thinks that she may have hit her head when she fell forward and slid down the wall.  No vomiting after either of these spells.  Patient presents with continued headache.  No confusion or neck stiffness.  No weakness in the arms of the legs.  No difficulty walking or talking.     Past Medical History:  Diagnosis Date  . Headache   . Hx of varicella   . Migraine     Patient Active Problem List   Diagnosis Date Noted  . Postpartum care following vaginal delivery (5/18) 05/14/2016  . ROM (rupture of membranes), premature 05/12/2016    Past Surgical History:  Procedure Laterality Date  . TONSILLECTOMY       OB History    Gravida  1   Para  1   Term  1   Preterm      AB      Living  1     SAB      TAB      Ectopic      Multiple  0   Live Births  1            Home Medications    Prior to Admission medications   Medication Sig Start Date End Date Taking? Authorizing Provider    ibuprofen (ADVIL,MOTRIN) 600 MG tablet Take 1 tablet (600 mg total) by mouth every 6 (six) hours. 05/15/16   Marlinda Mike, CNM  Prenatal Vit-Fe Fumarate-FA (PRENATAL MULTIVITAMIN) TABS tablet Take 1 tablet by mouth daily at 12 noon.    [provider]    Family History Family History  Problem Relation Age of Onset  . Varicose Veins Mother   . Cancer Mother   . Diabetes Father   . Cancer Father   . Hypertension Father   . Varicose Veins Brother   . Heart disease Paternal Grandfather     Social History Social History   Tobacco Use  . Smoking status: Never Smoker  . Smokeless tobacco: Never Used  Substance Use Topics  . Alcohol use: No  . Drug use: No     Allergies   Patient has no known allergies.   Review of Systems Review of Systems  Constitutional: Negative for fever.  HENT: Negative for congestion, dental problem, rhinorrhea and sinus pressure.   Eyes: Positive for photophobia. Negative for discharge, redness and visual disturbance.  Respiratory: Negative for shortness of breath.  Cardiovascular: Negative for chest pain.  Gastrointestinal: Positive for nausea. Negative for vomiting.  Musculoskeletal: Positive for back pain and neck pain. Negative for gait problem and neck stiffness.  Skin: Negative for rash.  Neurological: Positive for syncope, light-headedness and headaches. Negative for speech difficulty, weakness and numbness.  Psychiatric/Behavioral: Negative for confusion.     Physical Exam Updated Vital Signs BP 124/79 (BP Location: Right Arm)   Pulse 85   Temp 98.7 F (37.1 C) (Oral)   Resp 14   Ht 5\' 7"  (1.702 m)   Wt 99.8 kg   LMP 10/04/2018   SpO2 100%   BMI 34.46 kg/m   Physical Exam  Constitutional: She is oriented to person, place, and time. She appears well-developed and well-nourished.  HENT:  Head: Normocephalic and atraumatic. Head is without raccoon's eyes and without Battle's sign.  Right Ear: Tympanic membrane,  external ear and ear canal normal. No hemotympanum.  Left Ear: Tympanic membrane, external ear and ear canal normal. No hemotympanum.  Nose: Nose normal. No nasal septal hematoma.  Mouth/Throat: Uvula is midline, oropharynx is clear and moist and mucous membranes are normal.  Eyes: Pupils are equal, round, and reactive to light. Conjunctivae, EOM and lids are normal. Right eye exhibits no nystagmus. Left eye exhibits no nystagmus.  No visible hyphema noted  Neck: Normal range of motion. Neck supple.  No meningeal signs.  Full range of motion of neck.  Cardiovascular: Normal rate and regular rhythm.  Pulmonary/Chest: Effort normal and breath sounds normal.  Abdominal: Soft. There is no tenderness.  Musculoskeletal: She exhibits no edema or tenderness.       Cervical back: She exhibits normal range of motion, no tenderness and no bony tenderness.       Thoracic back: She exhibits no tenderness and no bony tenderness.       Lumbar back: She exhibits no tenderness and no bony tenderness.  Neurological: She is alert and oriented to person, place, and time. She has normal strength and normal reflexes. No cranial nerve deficit or sensory deficit. She displays a negative Romberg sign. Coordination and gait normal. GCS eye subscore is 4. GCS verbal subscore is 5. GCS motor subscore is 6.  Skin: Skin is warm and dry.  Psychiatric: She has a normal mood and affect.  Nursing note and vitals reviewed.    ED Treatments / Results  Labs (all labs ordered are listed, but only abnormal results are displayed) Labs Reviewed  URINALYSIS, ROUTINE W REFLEX MICROSCOPIC - Abnormal; Notable for the following components:      Result Value   Hgb urine dipstick TRACE (*)    All other components within normal limits  BASIC METABOLIC PANEL - Abnormal; Notable for the following components:   Calcium 8.8 (*)    All other components within normal limits  URINALYSIS, MICROSCOPIC (REFLEX) - Abnormal; Notable for the  following components:   Bacteria, UA RARE (*)    All other components within normal limits  PREGNANCY, URINE  CBC WITH DIFFERENTIAL/PLATELET    EKG EKG Interpretation  Date/Time:  Wednesday October 04 2018 11:24:40 EDT Ventricular Rate:  54 PR Interval:    QRS Duration: 101 QT Interval:  436 QTC Calculation: 414 R Axis:   54 Text Interpretation:  Sinus rhythm no prior ECG for comparison.  Sinus bradycardia.  No STEMI Confirmed by Theda Belfast ([id number]) on 10/04/2018 11:28:08 AM   Radiology Ct Head Wo Contrast  Result Date: 10/04/2018 CLINICAL DATA:  Headaches since the weekend, nausea,  dizziness, posttraumatic headache EXAM: CT HEAD WITHOUT CONTRAST TECHNIQUE: Contiguous axial images were obtained from the base of the skull through the vertex without intravenous contrast. Sagittal and coronal MPR images reconstructed from axial data set. COMPARISON:  None FINDINGS: Brain: Normal ventricular morphology. No midline shift or mass effect. Normal appearance of brain parenchyma. No intracranial hemorrhage, mass lesion, evidence of acute infarction, or extra-axial fluid collection. Vascular: Normal appearance Skull: Normal appearance Sinuses/Orbits: Clear Other: N/A IMPRESSION: Normal exam. Electronically Signed   By: Ulyses Southward M.D.   On: 10/04/2018 11:13    Procedures Procedures (including critical care time)  Medications Ordered in ED Medications  sodium chloride 0.9 % bolus 1,000 mL (1,000 mLs Intravenous New Bag/Given 10/04/18 1053)  metoCLOPramide (REGLAN) injection 10 mg (10 mg Intravenous Given 10/04/18 1055)  diphenhydrAMINE (BENADRYL) injection 25 mg (25 mg Intravenous Given 10/04/18 1053)     Initial Impression / Assessment and Plan / ED Course  I have reviewed the triage vital signs and the nursing notes.  Pertinent labs & imaging results that were available during my care of the patient were reviewed by me and considered in my medical decision making (see chart for  details).     Patient seen and examined. Work-up initiated. Medications ordered.  Patient is currently at her baseline.  We will hydrate.  Given severe headache and head injury this morning, will obtain head CT.  Will check EKG given syncopal episode.  Will reassess.  Vital signs reviewed and are as follows: BP 124/79 (BP Location: Right Arm)   Pulse 85   Temp 98.7 F (37.1 C) (Oral)   Resp 14   Ht 5\' 7"  (1.702 m)   Wt 99.8 kg   LMP 10/04/2018   SpO2 100%   BMI 34.46 kg/m   12:21 PM EKG, lab work-up, head imaging is negative.  Patient updated.  Patient is feeling a bit better.  She will be discharged home after fluids.  She is in agreement with this plan.  Ambulatory referral made for headache follow-up.  Patient is interested in following up with a neurologist given that previous treatments have been of limited utility.  Patient counseled to return if they have weakness in their arms or legs, slurred speech, trouble walking or talking, confusion, trouble with their balance, or if they have any other concerns. Patient verbalizes understanding and agrees with plan.    Final Clinical Impressions(s) / ED Diagnoses   Final diagnoses:  Bad headache  Syncope, unspecified syncope type   Patient without high-risk features of headache including: sudden onset/thunderclap HA, no similar headache in past, altered mental status, accompanying seizure, headache with exertion, age > 29, history of immunocompromise, neck or shoulder pain, fever, use of anticoagulation, family history of spontaneous SAH, concomitant drug use, toxic exposure.   Patient has a normal complete neurological exam, normal vital signs, normal level of consciousness, no signs of meningismus, is well-appearing/non-toxic appearing, no signs of trauma.   No dangerous or life-threatening conditions suspected or identified by history, physical exam, and by work-up. No indications for hospitalization identified.   Syncope: In  setting of HA. Possible head injury with fall today. Head imaging is negative.  EKG is reassuring.  No anemia.  ED Discharge Orders         Ordered    Ambulatory referral to Neurology    Comments:  An appointment is requested in approximately: 2 weeks  Patient with headaches, likely migraine, limited success with previous treatments   10/04/18 1218  Renne Crigler, PA-C 10/04/18 1223    Tegeler, Canary Brim, MD 10/04/18 [phone]

## 2018-10-04 NOTE — Discharge Instructions (Signed)
Please read and follow all provided instructions.  Your diagnoses today include:  1. Bad headache   2. Syncope, unspecified syncope type     Tests performed today include:  CT of your head which was normal and did not show any serious cause of your headache  EKG  Blood counts and electrolytes - no concerning findings  Vital signs. See below for your results today.   Medications:  In the Emergency Department you received:  Reglan - antinausea/headache medication  Benadryl - antihistamine to counteract potential side effects of reglan  Take any prescribed medications only as directed.  Additional information:  Follow any educational materials contained in this packet.  You are having a headache. No specific cause was found today for your headache. It may have been a migraine or other cause of headache. Stress, anxiety, fatigue, and depression are common triggers for headaches.   Your headache today does not appear to be life-threatening or require hospitalization, but often the exact cause of headaches is not determined in the emergency department. Therefore, follow-up with your doctor is very important to find out what may have caused your headache and whether or not you need any further diagnostic testing or treatment.   Sometimes headaches can appear benign (not harmful), but then more serious symptoms can develop which should prompt an immediate re-evaluation by your doctor or the emergency department.  BE VERY CAREFUL not to take multiple medicines containing Tylenol (also called acetaminophen). Doing so can lead to an overdose which can damage your liver and cause liver failure and possibly death.   Follow-up instructions: Please follow-up with your primary care provider in the next 3 days for further evaluation of your symptoms.   Return instructions:   Please return to the Emergency Department if you experience worsening symptoms.  Return if the medications do not  resolve your headache, if it recurs, or if you have multiple episodes of vomiting or cannot keep down fluids.  Return if you have a change from the usual headache.  RETURN IMMEDIATELY IF you:  Develop a sudden, severe headache  Develop confusion or become poorly responsive or faint  Develop a fever above 100.44F or problem breathing  Have a change in speech, vision, swallowing, or understanding  Develop new weakness, numbness, tingling, incoordination in your arms or legs  Have a seizure  Please return if you have any other emergent concerns.  Additional Information:  Your vital signs today were: BP 124/79 (BP Location: Right Arm)    Pulse 85    Temp 98.7 F (37.1 C) (Oral)    Resp 14    Ht 5\' 7"  (1.702 m)    Wt 99.8 kg    LMP 09/20/2018    SpO2 100%    BMI 34.46 kg/m  If your blood pressure (BP) was elevated above 135/85 this visit, please have this repeated by your doctor within one month. --------------

## 2018-10-09 ENCOUNTER — Encounter: Payer: Self-pay | Admitting: Neurology

## 2018-12-14 NOTE — Progress Notes (Signed)
NEUROLOGY CONSULTATION NOTE  Kendra Baird MRN: [id number] DOB: [birth year]  Referring provider: Desmond Dike (ED referral) Primary care provider: No PCP  Reason for consult:  headache  HISTORY OF PRESENT ILLNESS: Kendra Baird is a 27 year old right-handed female who presents for headaches.  History supplemented by ED note.  She just found out she is pregnant.  Onset:  She has had migraines since highschool.  She reports a few concussions in highschool basketball.  Severe in college and then improved.   Location:  Right peri-orbital and then radiates over the right side of her head to the shoulder. Quality:  Pressure/non-throbbing Intensity:  4-5/10 (8.5/10 debilitating).  She denies new headache, thunderclap headache Aura:  No Prodrome:  No Postdrome:  No Associated symptoms:  Loss of appetite, sometimes nausea, photophobia or phonophobia.  She denies associated unilateral numbness or weakness. Duration:  All day Frequency:  Usually every other month (debilitating one every 4 months) Frequency of abortive medication: every other month Triggers:  Certain prenatal vitamins Relieving factors:  Sleep, caffeine, hydration Activity:  No  On 10/04/2018 she had another typical debilitating migraine.  However, she had passed out, which was different.  She went to the ED for evaluation and treatment of her headache.  She received a headache cocktail of Reglan and Benadryl.  CT of the head without contrast was personally reviewed and was unremarkable.  She hasn't had a migraine since then.  She found out she is [redacted] weeks pregnant with her second child.   Current NSAIDS:  None Current analgesics:  None Current triptans:  None Current ergotamine:  None Current anti-emetic:  None Current muscle relaxants:  None  Current anti-anxiolytic:  None Current sleep aide:  None Current Antihypertensive medications:  None Current Antidepressant medications:  None Current Anticonvulsant  medications:  None Current anti-CGRP:  None Current Vitamins/Herbal/Supplements:  prenatal Current Antihistamines/Decongestants:  None Other therapy:  None  Past NSAIDS:  Ibuprofen, naproxen Past analgesics:  Tylenol, Excedrin Past abortive triptans:  Maybe sumatriptan Past abortive ergotamine:  None Past muscle relaxants:  None Past anti-emetic:  None  Past antihypertensive medications:  None Past antidepressant medications:  None Past anticonvulsant medications:  None Past anti-CGRP:  None Past vitamins/Herbal/Supplements:  None Past antihistamines/decongestants:  None Other past therapies:  None  Caffeine:  Rarely drinks coffee Diet:  Drinks 1 gallon of water daily.  Drinks little soda. Exercise:  Runs, light free weights Depression:  no; Anxiety:  no Other pain:  no Sleep hygiene:  good Family history of headache:  no  PAST MEDICAL HISTORY: Past Medical History:  Diagnosis Date  . Headache   . Hx of varicella   . Migraine     PAST SURGICAL HISTORY: Past Surgical History:  Procedure Laterality Date  . TONSILLECTOMY      MEDICATIONS: Current Outpatient Medications on File Prior to Visit  Medication Sig Dispense Refill  . ibuprofen (ADVIL,MOTRIN) 600 MG tablet Take 1 tablet (600 mg total) by mouth every 6 (six) hours. 30 tablet 0  . Prenatal Vit-Fe Fumarate-FA (PRENATAL MULTIVITAMIN) TABS tablet Take 1 tablet by mouth daily at 12 noon.     No current facility-administered medications on file prior to visit.     ALLERGIES: No Known Allergies  FAMILY HISTORY: Family History  Problem Relation Age of Onset  . Varicose Veins Mother   . Cancer Mother   . Diabetes Father   . Cancer Father   . Hypertension Father   . Varicose Veins Brother   . Heart  disease Paternal Grandfather    SOCIAL HISTORY: Social History   Socioeconomic History  . Marital status: Married    Spouse name: Not on file  . Number of children: Not on file  . Years of education: Not on  file  . Highest education level: Not on file  Occupational History  . Not on file  Social Needs  . Financial resource strain: Not on file  . Food insecurity:    Worry: Not on file    Inability: Not on file  . Transportation needs:    Medical: Not on file    Non-medical: Not on file  Tobacco Use  . Smoking status: Never Smoker  . Smokeless tobacco: Never Used  Substance and Sexual Activity  . Alcohol use: No  . Drug use: No  . Sexual activity: Not Currently  Lifestyle  . Physical activity:    Days per week: Not on file    Minutes per session: Not on file  . Stress: Not on file  Relationships  . Social connections:    Talks on phone: Not on file    Gets together: Not on file    Attends religious service: Not on file    Active member of club or organization: Not on file    Attends meetings of clubs or organizations: Not on file    Relationship status: Not on file  . Intimate partner violence:    Fear of current or ex partner: Not on file    Emotionally abused: Not on file    Physically abused: Not on file    Forced sexual activity: Not on file  Other Topics Concern  . Not on file  Social History Narrative  . Not on file    REVIEW OF SYSTEMS: Constitutional: No fevers, chills, or sweats, no generalized fatigue, change in appetite Eyes: No visual changes, double vision, eye pain Ear, nose and throat: No hearing loss, ear pain, nasal congestion, sore throat Cardiovascular: No chest pain, palpitations Respiratory:  No shortness of breath at rest or with exertion, wheezes GastrointestinaI: No nausea, vomiting, diarrhea, abdominal pain, fecal incontinence Genitourinary:  No dysuria, urinary retention or frequency Musculoskeletal:  No neck pain, back pain Integumentary: No rash, pruritus, skin lesions Neurological: as above Psychiatric: No depression, insomnia, anxiety Endocrine: No palpitations, fatigue, diaphoresis, mood swings, change in appetite, change in weight,  increased thirst Hematologic/Lymphatic:  No purpura, petechiae. Allergic/Immunologic: no itchy/runny eyes, nasal congestion, recent allergic reactions, rashes  PHYSICAL EXAM: Blood pressure 102/66, pulse 91, height 5\' 8"  (1.727 m), weight 231 lb (104.8 kg), SpO2 99 %, unknown if currently breastfeeding. General: No acute distress.  Patient appears well-groomed.   Head:  Normocephalic/atraumatic Eyes:  fundi examined but not visualized Neck: supple, no paraspinal tenderness, full range of motion Back: No paraspinal tenderness Heart: regular rate and rhythm Lungs: Clear to auscultation bilaterally. Vascular: No carotid bruits. Neurological Exam: Mental status: alert and oriented to person, place, and time, recent and remote memory intact, fund of knowledge intact, attention and concentration intact, speech fluent and not dysarthric, language intact. Cranial nerves: CN I: not tested CN II: pupils equal, round and reactive to light, visual fields intact CN III, IV, VI:  full range of motion, no nystagmus, no ptosis CN V: facial sensation intact CN VII: upper and lower face symmetric CN VIII: hearing intact CN IX, X: gag intact, uvula midline CN XI: sternocleidomastoid and trapezius muscles intact CN XII: tongue midline Bulk & Tone: normal, no fasciculations. Motor:  5/5  throughout  Sensation:  temperature and vibration sensation intact. Deep Tendon Reflexes:  2+ throughout, toes downgoing.   Finger to nose testing:  Without dysmetria.   Heel to shin:  Without dysmetria.   Gait:  Normal station and stride.  Able to turn and tandem walk. Romberg negative.  IMPRESSION: Migraine without aura, without status migrainosus, not intractable  PLAN: While pregnant, medications options are limited.  She will likely just take Tylenol for any acute headaches.  Hopefully, headaches are improved during her pregnancy anyway.  If they worsen, she may try taking daily magnesium.  I will have her  follow up with me after her pregnancy to discuss abortive migraine treatment options.  Thank you for allowing me to take part in the care of this patient.  Shon MilletAdam , DO

## 2018-12-15 ENCOUNTER — Ambulatory Visit (INDEPENDENT_AMBULATORY_CARE_PROVIDER_SITE_OTHER): Payer: No Typology Code available for payment source | Admitting: Neurology

## 2018-12-15 ENCOUNTER — Encounter: Payer: Self-pay | Admitting: Neurology

## 2018-12-15 ENCOUNTER — Encounter

## 2018-12-15 VITALS — BP 102/66 | HR 91 | Ht 68.0 in | Wt 231.0 lb

## 2018-12-15 DIAGNOSIS — G43009 Migraine without aura, not intractable, without status migrainosus: Secondary | ICD-10-CM | POA: Diagnosis not present

## 2018-12-27 NOTE — L&D Delivery Note (Signed)
Delivery Note At 5:37 PM a viable female was delivered via Vaginal, Spontaneous (Presentation:DOA ;  ).  APGAR: 8, 9; weight  pending.   Placenta status: spontaneous, intact , .  Cord: Three-vessel cord, loose nuchal, delivered through with the following complications: .  Partial third-degree laceration cord pH: N/A  Anesthesia: Epidural Episiotomy: None Lacerations: 3rd degree;Perineal Suture Repair: 2.0 3.0 vicryl rapide Partial tear of the external anal sphincter.  Rectal exam done to confirm mucosal integrity.  Allis clamps placed on the anal sphincter bilaterally and 3 separate sutures in a posterior inferior superior fashion all thrown then tied separately.  Remainder of the repair with a 3-0 Vicryl Rapide in standard fashion Est. Blood Loss (mL): 51  Mom to postpartum.  Baby to Couplet care / Skin to Skin.  Ala Dach 08/08/2019, 6:18 PM

## 2019-01-19 LAB — OB RESULTS CONSOLE HIV ANTIBODY (ROUTINE TESTING): HIV: NONREACTIVE

## 2019-01-19 LAB — OB RESULTS CONSOLE RPR: RPR: NONREACTIVE

## 2019-01-19 LAB — OB RESULTS CONSOLE ABO/RH: RH Type: POSITIVE

## 2019-01-19 LAB — OB RESULTS CONSOLE GC/CHLAMYDIA
Chlamydia: NEGATIVE
Gonorrhea: NEGATIVE

## 2019-01-19 LAB — OB RESULTS CONSOLE RUBELLA ANTIBODY, IGM: Rubella: IMMUNE

## 2019-01-19 LAB — OB RESULTS CONSOLE HEPATITIS B SURFACE ANTIGEN: Hepatitis B Surface Ag: NEGATIVE

## 2019-01-19 LAB — OB RESULTS CONSOLE ANTIBODY SCREEN: Antibody Screen: NEGATIVE

## 2019-03-04 IMAGING — CT CT HEAD W/O CM
3 series · 16 of 47 positions shown, 19 images · non-contrast
Comparison: None

CLINICAL DATA: Headaches since the weekend, nausea, dizziness,
posttraumatic headache

EXAM:
CT HEAD WITHOUT CONTRAST
TECHNIQUE: Contiguous axial images were obtained from the base of the skull
through the vertex without intravenous contrast. Sagittal and
coronal MPR images reconstructed from axial data set.

[Series 2: head wo · axial · 0.40mm/px · z∈[-231,-96]mm · 10 of 33 slices shown, 13 images]
[im 3/33  brain]
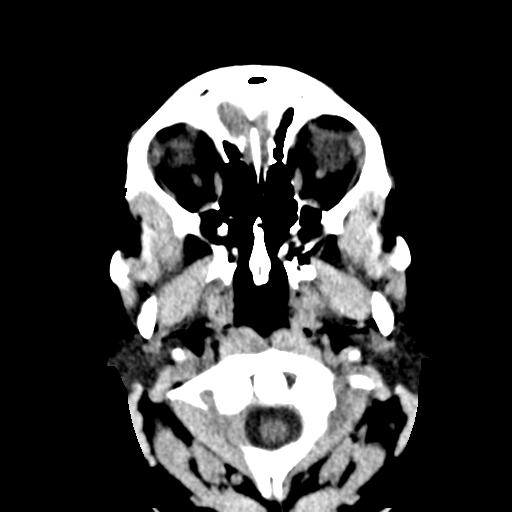
[im 3/33  bone]
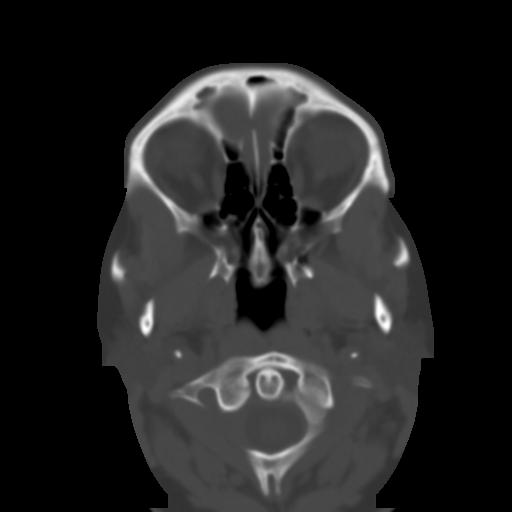
[im 6/33  brain]
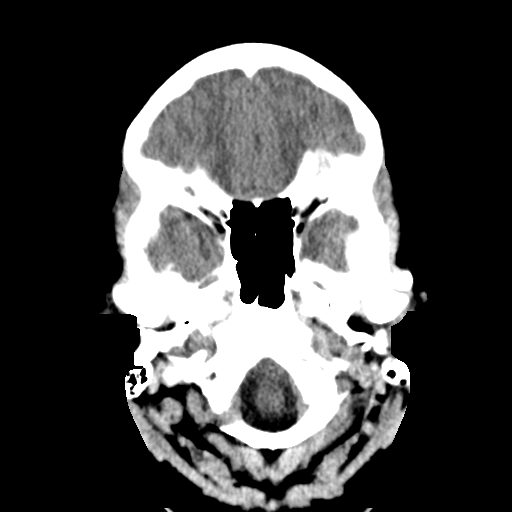
[im 9/33  brain]
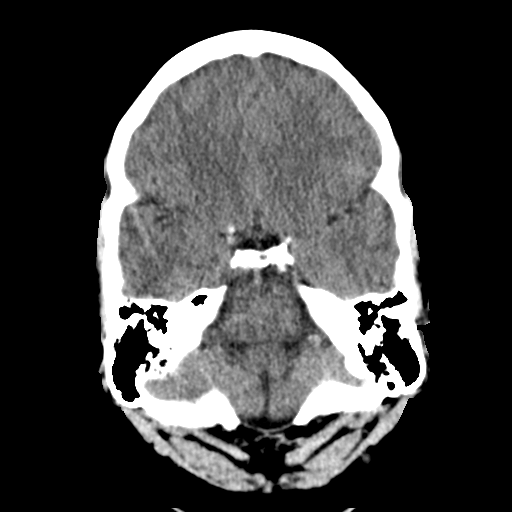
[im 12/33  brain]
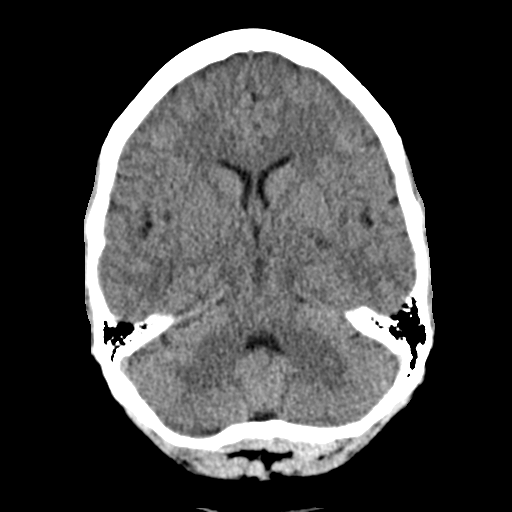
[im 15/33  brain]
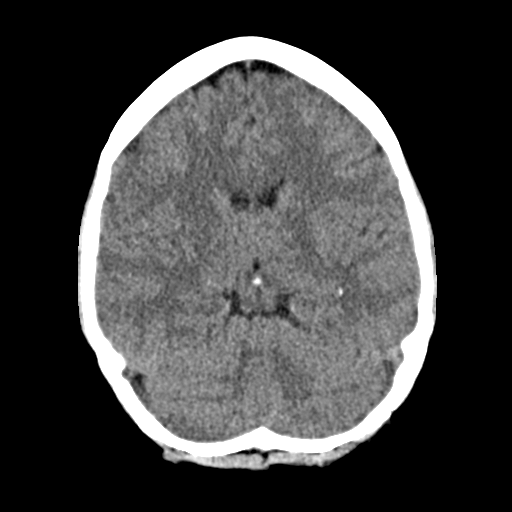
[im 15/33  bone]
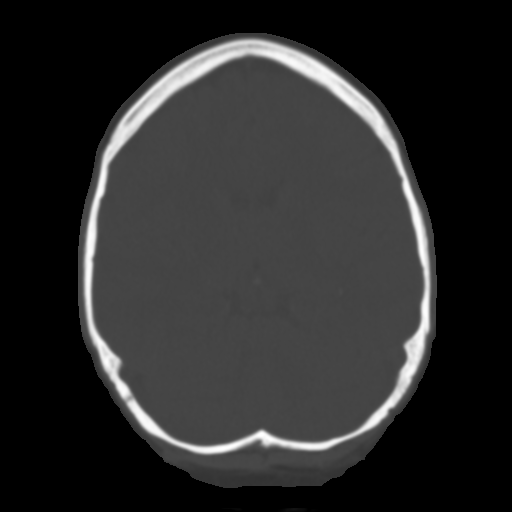
[im 18/33  brain]
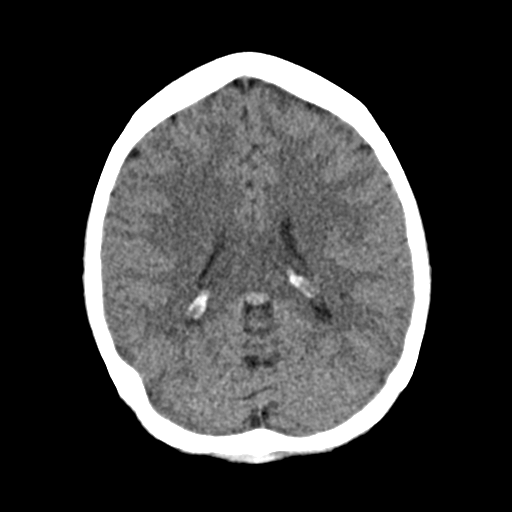
[im 21/33  brain]
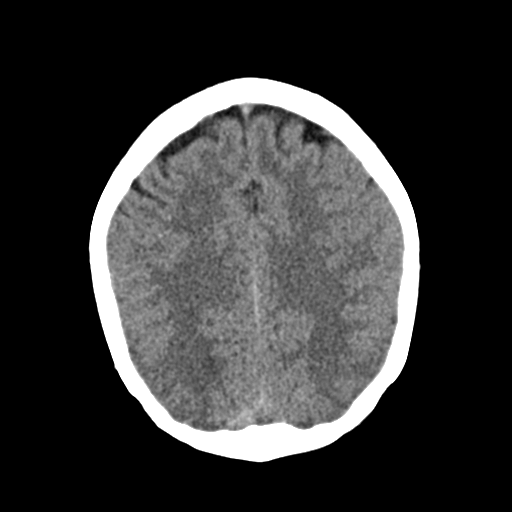
[im 25/33  brain]
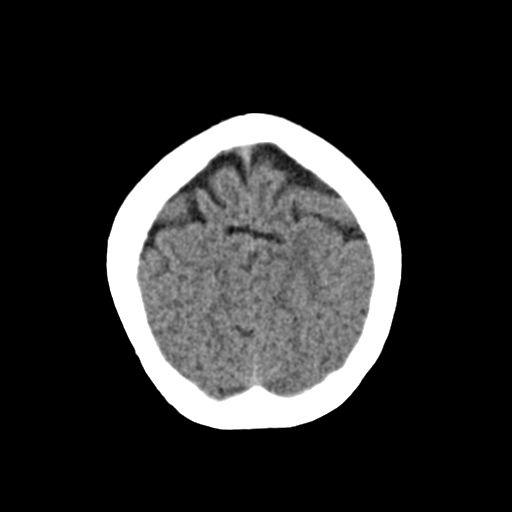
[im 27/33  brain]
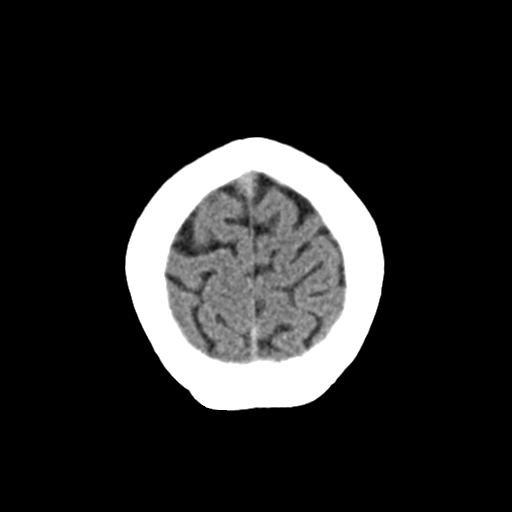
[im 27/33  bone]
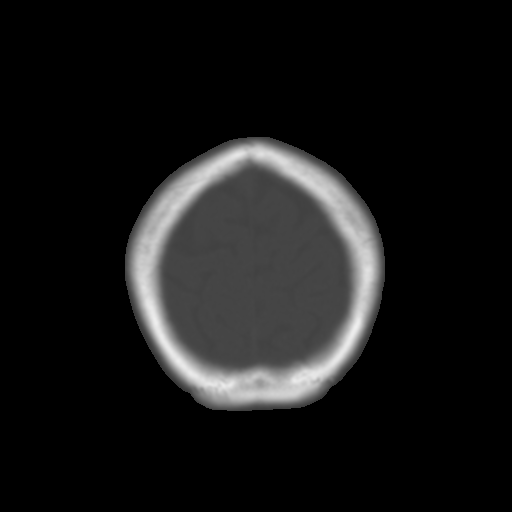
[im 30/33  brain]
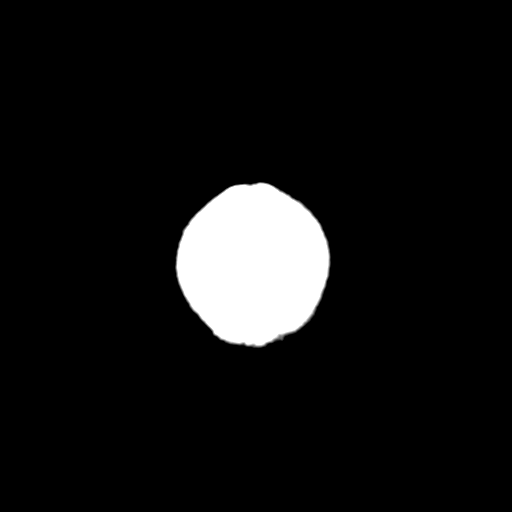

[Series 4: coronal soft · coronal · 0.34mm/px · 3 of 70 slices shown]
[im 24/70  brain]
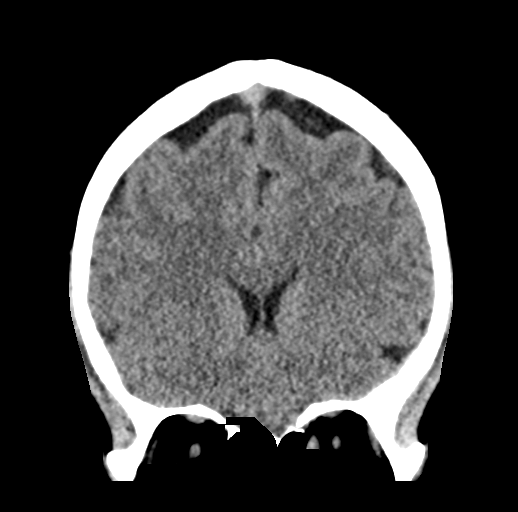
[im 31/70  brain]
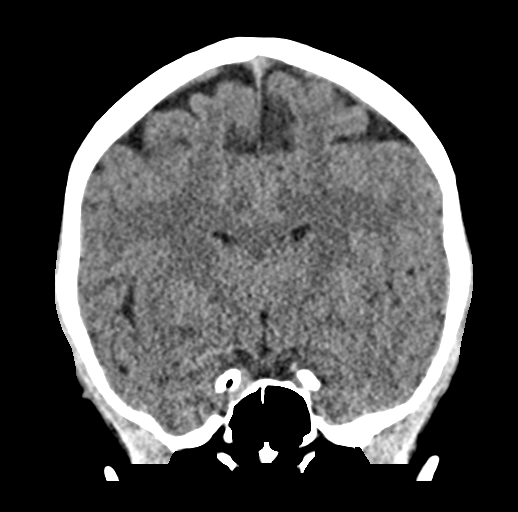
[im 39/70  brain]
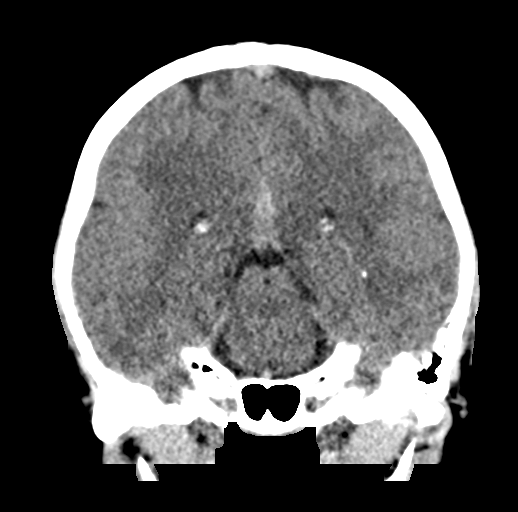

[Series 5: sag soft · sagittal · 0.33mm/px · 3 of 53 slices shown]
[im 18/53  brain]
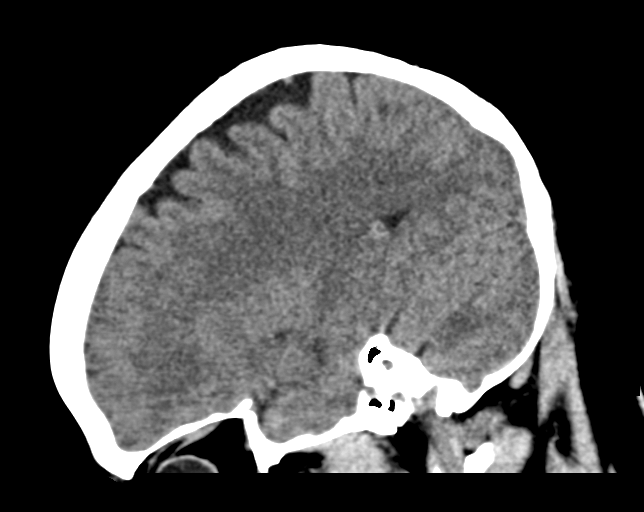
[im 27/53  brain]
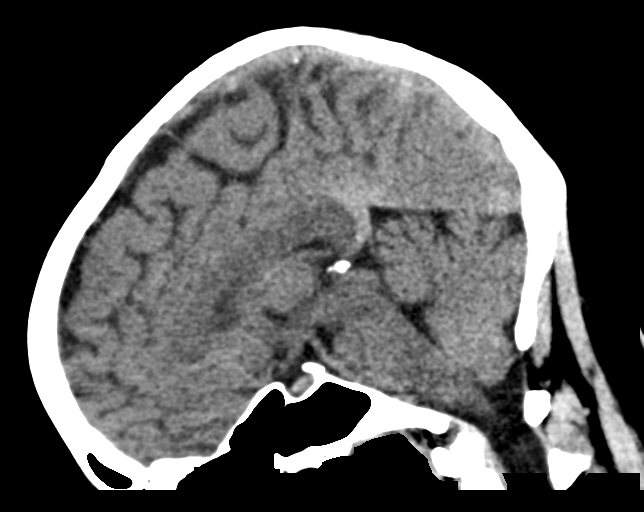
[im 35/53  brain]
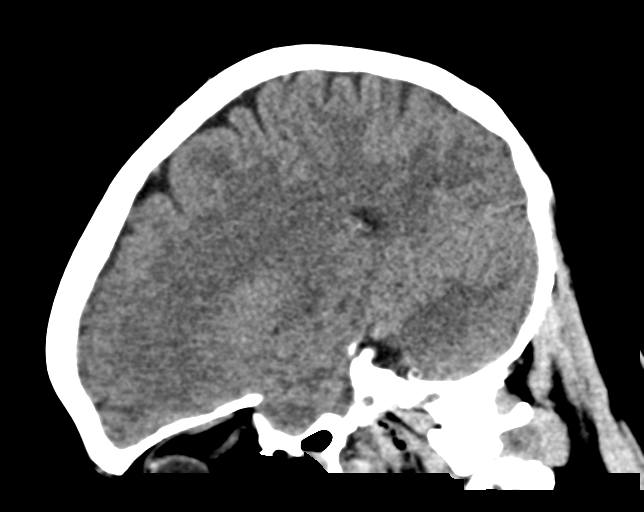

[16 of 47 positions shown; findings below may reference images not displayed]

FINDINGS: Brain: Normal ventricular morphology. No midline shift or mass
effect. Normal appearance of brain parenchyma. No intracranial
hemorrhage, mass lesion, evidence of acute infarction, or
extra-axial fluid collection.

Vascular: Normal appearance

Skull: Normal appearance

Sinuses/Orbits: Clear

Other: N/A
IMPRESSION: Normal exam.

## 2019-07-16 LAB — OB RESULTS CONSOLE GBS: GBS: NEGATIVE

## 2019-07-18 ENCOUNTER — Encounter (HOSPITAL_COMMUNITY): Payer: Self-pay | Admitting: *Deleted

## 2019-07-18 ENCOUNTER — Telehealth (HOSPITAL_COMMUNITY): Payer: Self-pay | Admitting: *Deleted

## 2019-07-18 NOTE — Telephone Encounter (Signed)
Preadmission screen  

## 2019-07-19 ENCOUNTER — Other Ambulatory Visit: Payer: Self-pay

## 2019-07-19 ENCOUNTER — Other Ambulatory Visit (HOSPITAL_COMMUNITY)
Admission: RE | Admit: 2019-07-19 | Discharge: 2019-07-19 | Disposition: A | Discharge status: Home / Self Care | Payer: No Typology Code available for payment source | Source: Ambulatory Visit | Attending: Obstetrics | Admitting: Obstetrics

## 2019-07-19 DIAGNOSIS — Z1159 Encounter for screening for other viral diseases: Secondary | ICD-10-CM | POA: Insufficient documentation

## 2019-07-19 LAB — SARS CORONAVIRUS 2 (TAT 6-24 HRS): SARS Coronavirus 2: NEGATIVE

## 2019-07-19 NOTE — MAU Note (Signed)
Asymptomatic, swab collected. 

## 2019-07-21 ENCOUNTER — Inpatient Hospital Stay (HOSPITAL_COMMUNITY)
Admission: RE | Admit: 2019-07-21 | Payer: No Typology Code available for payment source | Source: Ambulatory Visit | Admitting: Obstetrics

## 2019-07-21 ENCOUNTER — Other Ambulatory Visit: Payer: Self-pay

## 2019-07-21 ENCOUNTER — Encounter (HOSPITAL_COMMUNITY): Payer: Self-pay

## 2019-07-21 ENCOUNTER — Ambulatory Visit (HOSPITAL_COMMUNITY)
Admission: AD | Admit: 2019-07-21 | Discharge: 2019-07-21 | Disposition: A | Discharge status: Home / Self Care | Payer: No Typology Code available for payment source | Attending: Obstetrics | Admitting: Obstetrics

## 2019-07-21 DIAGNOSIS — R030 Elevated blood-pressure reading, without diagnosis of hypertension: Secondary | ICD-10-CM | POA: Insufficient documentation

## 2019-07-21 DIAGNOSIS — O321XX0 Maternal care for breech presentation, not applicable or unspecified: Secondary | ICD-10-CM | POA: Insufficient documentation

## 2019-07-21 DIAGNOSIS — Z3A37 37 weeks gestation of pregnancy: Secondary | ICD-10-CM | POA: Insufficient documentation

## 2019-07-21 DIAGNOSIS — O26893 Other specified pregnancy related conditions, third trimester: Secondary | ICD-10-CM | POA: Diagnosis not present

## 2019-07-21 DIAGNOSIS — Z3490 Encounter for supervision of normal pregnancy, unspecified, unspecified trimester: Secondary | ICD-10-CM

## 2019-07-21 LAB — TYPE AND SCREEN
ABO/RH(D): O POS
Antibody Screen: NEGATIVE

## 2019-07-21 LAB — CBC
HCT: 37.8 % (ref 36.0–46.0)
Hemoglobin: 13 g/dL (ref 12.0–15.0)
MCH: 31 pg (ref 26.0–34.0)
MCHC: 34.4 g/dL (ref 30.0–36.0)
MCV: 90 fL (ref 80.0–100.0)
Platelets: 185 10*3/uL (ref 150–400)
RBC: 4.2 MIL/uL (ref 3.87–5.11)
RDW: 12.4 % (ref 11.5–15.5)
WBC: 8.4 10*3/uL (ref 4.0–10.5)
nRBC: 0 % (ref 0.0–0.2)

## 2019-07-21 MED ORDER — TERBUTALINE SULFATE 1 MG/ML IJ SOLN: 0.2500 mg | Freq: Once | INTRAMUSCULAR | Status: DC

## 2019-07-21 MED ORDER — LACTATED RINGERS IV SOLN
INTRAVENOUS | Status: DC
  Administered 2019-07-21: 08:00:00 via INTRAVENOUS

## 2019-07-21 NOTE — Progress Notes (Signed)
Fetal heart rate strip second reviewed with Chilton Greathouse, RN.

## 2019-07-21 NOTE — Progress Notes (Signed)
See note by Melina Copa.  Met with pt, has been doing spinning babies since breech discovered in office.   No HA, no vision change, no RUQ pain.  PE: Gen: well appearing, no distress Abd: gravid, NT, no RUQ pain LE: NT, no edema Cvx: FT/ long/ vtx high/ floating  U/s repeated by me: vtx, grossly nl AFI  A/P: vtx, no evidence PEC, no evidence labor, reactive fetal testing.  D/c home  Ala Dach 07/21/2019 8:29 AM

## 2019-07-21 NOTE — Progress Notes (Signed)
Patient ID: Kendra Baird, female   DOB: Feb 15, 1991, 28 y.o.   MRN: [id number]   Patient is G2P1001 at [redacted]w[redacted]d here for version, breech presentation in office on Monday.  Here for scheduled ECV, has been counseled about R/B and agrees.  Denies LOF/VB/ctx, + FM. Reports practicing spinningbabies exercises for breech.   Vitals:   07/21/19 0724  BP: (!) 133/91  Pulse: 75  Resp: 17  Temp: 98.5 F (36.9 C)  SpO2: 100%    FHR 130, mod var, + accels, no decels Ctx occasional, mild  Bedside sono - vertex, fetal spine to maternal L, amniotic fluid subjectively normal, fundal placenta, FCM noted.   A/P: G2P1001 at [redacted]w[redacted]d, previously breech Spontaneous vertex on sono this am Elevated initial BP, no sx of PEC, will rpt serial BP this AM Dr. Pamala Hurry notified of findings.   Kendra Pina, MSN, CNM 07/21/2019, 8:13 AM

## 2019-07-21 NOTE — Discharge Instructions (Signed)
Signs and Symptoms of Labor Labor is your body's natural process of moving your baby, placenta, and umbilical cord out of your uterus. The process of labor usually starts when your baby is full-term, between 37 and 40 weeks of pregnancy. How will I know when I am close to going into labor? As your body prepares for labor and the birth of your baby, you may notice the following symptoms in the weeks and days before true labor starts:  Having a strong desire to get your home ready to receive your new baby. This is called nesting. Nesting may be a sign that labor is approaching, and it may occur several weeks before birth. Nesting may involve cleaning and organizing your home.  Passing a small amount of thick, bloody mucus out of your vagina (normal bloody show or losing your mucus plug). This may happen more than a week before labor begins, or it might occur right before labor begins as the opening of the cervix starts to widen (dilate). For some women, the entire mucus plug passes at once. For others, smaller portions of the mucus plug may gradually pass over several days.  Your baby moving (dropping) lower in your pelvis to get into position for birth (lightening). When this happens, you may feel more pressure on your bladder and pelvic bone and less pressure on your ribs. This may make it easier to breathe. It may also cause you to need to urinate more often and have problems with bowel movements.  Having "practice contractions" (Braxton Hicks contractions) that occur at irregular (unevenly spaced) intervals that are more than 10 minutes apart. This is also called false labor. False labor contractions are common after exercise or sexual activity, and they will stop if you change position, rest, or drink fluids. These contractions are usually mild and do not get stronger over time. They may feel like: ? A backache or back pain. ? Mild cramps, similar to menstrual cramps. ? Tightening or pressure in  your abdomen. Other early symptoms that labor may be starting soon include:  Nausea or loss of appetite.  Diarrhea.  Having a sudden burst of energy, or feeling very tired.  Mood changes.  Having trouble sleeping. How will I know when labor has begun? Signs that true labor has begun may include:  Having contractions that come at regular (evenly spaced) intervals and increase in intensity. This may feel like more intense tightening or pressure in your abdomen that moves to your back. ? Contractions may also feel like rhythmic pain in your upper thighs or back that comes and goes at regular intervals. ? For first-time mothers, this change in intensity of contractions often occurs at a more gradual pace. ? Women who have given birth before may notice a more rapid progression of contraction changes.  Having a feeling of pressure in the vaginal area.  Your water breaking (rupture of membranes). This is when the sac of fluid that surrounds your baby breaks. When this happens, you will notice fluid leaking from your vagina. This may be clear or blood-tinged. Labor usually starts within 24 hours of your water breaking, but it may take longer to begin. ? Some women notice this as a gush of fluid. ? Others notice that their underwear repeatedly becomes damp. Follow these instructions at home:   When labor starts, or if your water breaks, call your health care provider or nurse care line. Based on your situation, they will determine when you should go in for an   exam.  When you are in early labor, you may be able to rest and manage symptoms at home. Some strategies to try at home include: ? Breathing and relaxation techniques. ? Taking a warm bath or shower. ? Listening to music. ? Using a heating pad on the lower back for pain. If you are directed to use heat:  Place a towel between your skin and the heat source.  Leave the heat on for 20-30 minutes.  Remove the heat if your skin turns  bright red. This is especially important if you are unable to feel pain, heat, or cold. You may have a greater risk of getting burned. Get help right away if:  You have painful, regular contractions that are 5 minutes apart or less.  Labor starts before you are [redacted] weeks along in your pregnancy.  You have a fever.  You have a headache that does not go away.  You have bright red blood coming from your vagina.  You do not feel your baby moving.  You have a sudden onset of: ? Severe headache with vision problems. ? Nausea, vomiting, or diarrhea. ? Chest pain or shortness of breath. These symptoms may be an emergency. If your health care provider recommends that you go to the hospital or birth center where you plan to deliver, do not drive yourself. Have someone else drive you, or call emergency services (911 in the [place] Summary  Labor is your body's natural process of moving your baby, placenta, and umbilical cord out of your uterus.  The process of labor usually starts when your baby is full-term, between 84 and 40 weeks of pregnancy.  When labor starts, or if your water breaks, call your health care provider or nurse care line. Based on your situation, they will determine when you should go in for an exam. This information is not intended to replace advice given to you by your health care provider. Make sure you discuss any questions you have with your health care provider. Document Released: 05/20/2017 Document Revised: 09/12/2017 Document Reviewed: 05/20/2017 Elsevier Patient Education  2020 [employer]. Vaginal Delivery  Vaginal delivery means that you give birth by pushing your baby out of your birth canal (vagina). A team of health care providers will help you before, during, and after vaginal delivery. Birth experiences are unique for every woman and every pregnancy, and birth experiences vary depending on where you choose to give birth. What happens when I arrive at the birth  center or hospital? Once you are in labor and have been admitted into the hospital or birth center, your health care provider may:  Review your pregnancy history and any concerns that you have.  Insert an IV into one of your veins. This may be used to give you fluids and medicines.  Check your blood pressure, pulse, temperature, and heart rate (vital signs).  Check whether your bag of water (amniotic sac) has broken (ruptured).  Talk with you about your birth plan and discuss pain control options. Monitoring Your health care provider may monitor your contractions (uterine monitoring) and your baby's heart rate (fetal monitoring). You may need to be monitored:  Often, but not continuously (intermittently).  All the time or for long periods at a time (continuously). Continuous monitoring may be needed if: ? You are taking certain medicines, such as medicine to relieve pain or make your contractions stronger. ? You have pregnancy or labor complications. Monitoring may be done by:  Placing a special stethoscope or  a handheld monitoring device on your abdomen to check your baby's heartbeat and to check for contractions.  Placing monitors on your abdomen (external monitors) to record your baby's heartbeat and the frequency and length of contractions.  Placing monitors inside your uterus through your vagina (internal monitors) to record your baby's heartbeat and the frequency, length, and strength of your contractions. Depending on the type of monitor, it may remain in your uterus or on your baby's head until birth.  Telemetry. This is a type of continuous monitoring that can be done with external or internal monitors. Instead of having to stay in bed, you are able to move around during telemetry. Physical exam Your health care provider may perform frequent physical exams. This may include:  Checking how and where your baby is positioned in your uterus.  Checking your cervix to  determine: ? Whether it is thinning out (effacing). ? Whether it is opening up (dilating). What happens during labor and delivery?  Normal labor and delivery is divided into the following three stages: Stage 1  This is the longest stage of labor.  This stage can last for hours or days.  Throughout this stage, you will feel contractions. Contractions generally feel mild, infrequent, and irregular at first. They get stronger, more frequent (about every 2-3 minutes), and more regular as you move through this stage.  This stage ends when your cervix is completely dilated to 4 inches (10 cm) and completely effaced. Stage 2  This stage starts once your cervix is completely effaced and dilated and lasts until the delivery of your baby.  This stage may last from 20 minutes to 2 hours.  This is the stage where you will feel an urge to push your baby out of your vagina.  You may feel stretching and burning pain, especially when the widest part of your baby's head passes through the vaginal opening (crowning).  Once your baby is delivered, the umbilical cord will be clamped and cut. This usually occurs after waiting a period of 1-2 minutes after delivery.  Your baby will be placed on your bare chest (skin-to-skin contact) in an upright position and covered with a warm blanket. Watch your baby for feeding cues, like rooting or sucking, and help the baby to your breast for his or her first feeding. Stage 3  This stage starts immediately after the birth of your baby and ends after you deliver the placenta.  This stage may take anywhere from 5 to 30 minutes.  After your baby has been delivered, you will feel contractions as your body expels the placenta and your uterus contracts to control bleeding. What can I expect after labor and delivery?  After labor is over, you and your baby will be monitored closely until you are ready to go home to ensure that you are both healthy. Your health care  team will teach you how to care for yourself and your baby.  You and your baby will stay in the same room (rooming in) during your hospital stay. This will encourage early bonding and successful breastfeeding.  You may continue to receive fluids and medicines through an IV.  Your uterus will be checked and massaged regularly (fundal massage).  You will have some soreness and pain in your abdomen, vagina, and the area of skin between your vaginal opening and your anus (perineum).  If an incision was made near your vagina (episiotomy) or if you had some vaginal tearing during delivery, cold compresses may be placed  on your episiotomy or your tear. This helps to reduce pain and swelling.  You may be given a squirt bottle to use instead of wiping when you go to the bathroom. To use the squirt bottle, follow these steps: ? Before you urinate, fill the squirt bottle with warm water. Do not use hot water. ? After you urinate, while you are sitting on the toilet, use the squirt bottle to rinse the area around your urethra and vaginal opening. This rinses away any urine and blood. ? Fill the squirt bottle with clean water every time you use the bathroom.  It is normal to have vaginal bleeding after delivery. Wear a sanitary pad for vaginal bleeding and discharge. Summary  Vaginal delivery means that you will give birth by pushing your baby out of your birth canal (vagina).  Your health care provider may monitor your contractions (uterine monitoring) and your baby's heart rate (fetal monitoring).  Your health care provider may perform a physical exam.  Normal labor and delivery is divided into three stages.  After labor is over, you and your baby will be monitored closely until you are ready to go home. This information is not intended to replace advice given to you by your health care provider. Make sure you discuss any questions you have with your health care provider. Document Released:  09/21/2008 Document Revised: 01/17/2018 Document Reviewed: 01/17/2018 Elsevier Patient Education  2020 [employer]

## 2019-07-22 LAB — ABO/RH: ABO/RH(D): O POS

## 2019-07-30 ENCOUNTER — Telehealth (HOSPITAL_COMMUNITY): Payer: Self-pay | Admitting: *Deleted

## 2019-07-30 NOTE — Telephone Encounter (Signed)
Preadmission screen  

## 2019-08-03 ENCOUNTER — Other Ambulatory Visit: Payer: Self-pay | Admitting: Obstetrics

## 2019-08-06 ENCOUNTER — Other Ambulatory Visit (HOSPITAL_COMMUNITY)
Admission: RE | Admit: 2019-08-06 | Discharge: 2019-08-06 | Disposition: A | Discharge status: Home / Self Care | Payer: No Typology Code available for payment source | Source: Ambulatory Visit | Attending: Obstetrics | Admitting: Obstetrics

## 2019-08-06 ENCOUNTER — Other Ambulatory Visit: Payer: Self-pay

## 2019-08-06 DIAGNOSIS — Z20828 Contact with and (suspected) exposure to other viral communicable diseases: Secondary | ICD-10-CM | POA: Insufficient documentation

## 2019-08-06 LAB — SARS CORONAVIRUS 2 (TAT 6-24 HRS): SARS Coronavirus 2: NEGATIVE

## 2019-08-06 NOTE — MAU Note (Signed)
Pt reported no symptoms. Nasal swab performed.

## 2019-08-07 NOTE — H&P (Signed)
Kendra Baird is a 27 y.o. G2P1001 at [redacted]w[redacted]d presenting for IOL. Pt notes occasional contractions . Good fetal movement, No vaginal bleeding, not leaking fluid.  [facility] at [facility] since 7 wks - Dated by LMP c/w 7 wk u/s - marginal CI, nl fetal growth - breech at 36 wks, presented for version and was vtx, ensure fetal position with IOL - obesity, 19# wt gain   Prenatal Transfer Tool  Maternal Diabetes: No Genetic Screening: Normal Maternal Ultrasounds/Referrals: Other: Fetal Ultrasounds or other Referrals:  None Maternal Substance Abuse:  No Significant Maternal Medications:  None Significant Maternal Lab Results: Group B Strep negative     OB History    Gravida  2   Para  1   Term  1   Preterm      AB      Living  1     SAB      TAB      Ectopic      Multiple  0   Live Births  1          Past Medical History:  Diagnosis Date  . Headache   . Hx of varicella   . Migraine    Past Surgical History:  Procedure Laterality Date  . KIDNEY STONE SURGERY    . MOLE REMOVAL    . TONSILLECTOMY     Family History: family history includes CVA in her father; Cancer in her mother; Cancer (age of onset: 24) in her father; Diabetes in her father; Heart attack in her father; Heart disease in her paternal grandfather; Hypertension in her father; Renal Disease in her father; Varicose Veins in her brother and mother. Social History:  reports that she has never smoked. She has never used smokeless tobacco. She reports that she does not drink alcohol or use drugs.  Review of Systems - Negative except discomfort of pregnancy     Last menstrual period 09/20/2018, unknown if currently breastfeeding.   Gen: well appearing, no distress CV: RRR Pulm: CTAB Back: no CVAT Abd: gravid, NT, no RUQ pain   Prenatal labs: ABO, Rh: --/--/O POS, O POS (07/25 0730) Antibody: NEG (07/25 0730) Rubella: Immune (01/24 0000) RPR: Nonreactive (01/24 0000)  HBsAg: Negative  (01/24 0000)  HIV: Non-reactive (01/24 0000)  GBS:   negative 1 hr Glucola 93  Genetic screening nl quad Anatomy US normal   Assessment/Plan: 28 y.o. G2P1001 at [redacted]w[redacted]d - term preg, proceed with elective IOL. Plan pitocin 2x2 GBS neg    Ala Dach 08/07/2019, 11:10 PM

## 2019-08-08 ENCOUNTER — Inpatient Hospital Stay (HOSPITAL_COMMUNITY): Payer: No Typology Code available for payment source | Admitting: Anesthesiology

## 2019-08-08 ENCOUNTER — Inpatient Hospital Stay (HOSPITAL_COMMUNITY)
Admission: AD | Admit: 2019-08-08 | Discharge: 2019-08-10 | DRG: 768 | Disposition: A | Discharge status: Home / Self Care | Payer: No Typology Code available for payment source | Attending: Obstetrics | Admitting: Obstetrics

## 2019-08-08 ENCOUNTER — Other Ambulatory Visit: Payer: Self-pay

## 2019-08-08 ENCOUNTER — Inpatient Hospital Stay (HOSPITAL_COMMUNITY): Payer: No Typology Code available for payment source

## 2019-08-08 ENCOUNTER — Encounter (HOSPITAL_COMMUNITY): Payer: Self-pay | Admitting: *Deleted

## 2019-08-08 ENCOUNTER — Inpatient Hospital Stay (HOSPITAL_COMMUNITY)
Admission: AD | Admit: 2019-08-08 | Payer: No Typology Code available for payment source | Source: Home / Self Care | Admitting: Obstetrics

## 2019-08-08 DIAGNOSIS — Z3A39 39 weeks gestation of pregnancy: Secondary | ICD-10-CM | POA: Diagnosis not present

## 2019-08-08 DIAGNOSIS — O26893 Other specified pregnancy related conditions, third trimester: Secondary | ICD-10-CM | POA: Diagnosis present

## 2019-08-08 DIAGNOSIS — Z3A37 37 weeks gestation of pregnancy: Secondary | ICD-10-CM

## 2019-08-08 LAB — RPR: RPR Ser Ql: NONREACTIVE

## 2019-08-08 LAB — CBC
HCT: 37.5 % (ref 36.0–46.0)
Hemoglobin: 12.8 g/dL (ref 12.0–15.0)
MCH: 31 pg (ref 26.0–34.0)
MCHC: 34.1 g/dL (ref 30.0–36.0)
MCV: 90.8 fL (ref 80.0–100.0)
Platelets: 177 10*3/uL (ref 150–400)
RBC: 4.13 MIL/uL (ref 3.87–5.11)
RDW: 12.7 % (ref 11.5–15.5)
WBC: 8.2 10*3/uL (ref 4.0–10.5)
nRBC: 0 % (ref 0.0–0.2)

## 2019-08-08 LAB — TYPE AND SCREEN
ABO/RH(D): O POS
Antibody Screen: NEGATIVE

## 2019-08-08 MED ORDER — SODIUM CHLORIDE (PF) 0.9 % IJ SOLN
INTRAMUSCULAR | Status: DC | PRN
  Administered 2019-08-08: 12 mL/h via EPIDURAL

## 2019-08-08 MED ORDER — OXYCODONE-ACETAMINOPHEN 5-325 MG PO TABS: 1.0000 | ORAL_TABLET | ORAL | Status: DC | PRN

## 2019-08-08 MED ORDER — LACTATED RINGERS IV SOLN
INTRAVENOUS | Status: DC
  Administered 2019-08-08 (×3): via INTRAVENOUS

## 2019-08-08 MED ORDER — OXYCODONE HCL 5 MG PO TABS: 10.0000 mg | ORAL_TABLET | ORAL | Status: DC | PRN

## 2019-08-08 MED ORDER — BENZOCAINE-MENTHOL 20-0.5 % EX AERO
1.0000 "application " | INHALATION_SPRAY | CUTANEOUS | Status: DC | PRN
  Administered 2019-08-08: 1 via TOPICAL
  Filled 2019-08-08: qty 56

## 2019-08-08 MED ORDER — TERBUTALINE SULFATE 1 MG/ML IJ SOLN: 0.2500 mg | Freq: Once | INTRAMUSCULAR | Status: DC | PRN

## 2019-08-08 MED ORDER — ACETAMINOPHEN 325 MG PO TABS: 650.0000 mg | ORAL_TABLET | ORAL | Status: DC | PRN

## 2019-08-08 MED ORDER — OXYCODONE HCL 5 MG PO TABS: 5.0000 mg | ORAL_TABLET | ORAL | Status: DC | PRN

## 2019-08-08 MED ORDER — ZOLPIDEM TARTRATE 5 MG PO TABS: 5.0000 mg | ORAL_TABLET | Freq: Every evening | ORAL | Status: DC | PRN

## 2019-08-08 MED ORDER — LACTATED RINGERS IV SOLN: 500.0000 mL | Freq: Once | INTRAVENOUS | Status: DC

## 2019-08-08 MED ORDER — ONDANSETRON HCL 4 MG/2ML IJ SOLN: 4.0000 mg | Freq: Four times a day (QID) | INTRAMUSCULAR | Status: DC | PRN

## 2019-08-08 MED ORDER — PHENYLEPHRINE 40 MCG/ML (10ML) SYRINGE FOR IV PUSH (FOR BLOOD PRESSURE SUPPORT): 80.0000 ug | PREFILLED_SYRINGE | INTRAVENOUS | Status: DC | PRN

## 2019-08-08 MED ORDER — PHENYLEPHRINE 40 MCG/ML (10ML) SYRINGE FOR IV PUSH (FOR BLOOD PRESSURE SUPPORT)
80.0000 ug | PREFILLED_SYRINGE | INTRAVENOUS | Status: DC | PRN
  Administered 2019-08-08: 13:00:00 80 ug via INTRAVENOUS
  Filled 2019-08-08: qty 10

## 2019-08-08 MED ORDER — EPHEDRINE 5 MG/ML INJ: 10.0000 mg | INTRAVENOUS | Status: DC | PRN

## 2019-08-08 MED ORDER — IBUPROFEN 600 MG PO TABS
600.0000 mg | ORAL_TABLET | Freq: Four times a day (QID) | ORAL | Status: DC
  Administered 2019-08-08 – 2019-08-10 (×7): 600 mg via ORAL
  Filled 2019-08-08 (×7): qty 1

## 2019-08-08 MED ORDER — LACTATED RINGERS IV SOLN: 500.0000 mL | INTRAVENOUS | Status: DC | PRN

## 2019-08-08 MED ORDER — WITCH HAZEL-GLYCERIN EX PADS: 1.0000 "application " | MEDICATED_PAD | CUTANEOUS | Status: DC | PRN

## 2019-08-08 MED ORDER — DIPHENHYDRAMINE HCL 25 MG PO CAPS: 25.0000 mg | ORAL_CAPSULE | Freq: Four times a day (QID) | ORAL | Status: DC | PRN

## 2019-08-08 MED ORDER — LIDOCAINE HCL (PF) 1 % IJ SOLN: 30.0000 mL | INTRAMUSCULAR | Status: DC | PRN

## 2019-08-08 MED ORDER — SOD CITRATE-CITRIC ACID 500-334 MG/5ML PO SOLN: 30.0000 mL | ORAL | Status: DC | PRN

## 2019-08-08 MED ORDER — DIBUCAINE (PERIANAL) 1 % EX OINT: 1.0000 "application " | TOPICAL_OINTMENT | CUTANEOUS | Status: DC | PRN

## 2019-08-08 MED ORDER — DIPHENHYDRAMINE HCL 50 MG/ML IJ SOLN: 12.5000 mg | INTRAMUSCULAR | Status: DC | PRN

## 2019-08-08 MED ORDER — PRENATAL MULTIVITAMIN CH
1.0000 | ORAL_TABLET | Freq: Every day | ORAL | Status: DC
  Administered 2019-08-09 – 2019-08-10 (×2): 1 via ORAL
  Filled 2019-08-08 (×2): qty 1

## 2019-08-08 MED ORDER — OXYCODONE-ACETAMINOPHEN 5-325 MG PO TABS: 2.0000 | ORAL_TABLET | ORAL | Status: DC | PRN

## 2019-08-08 MED ORDER — SENNOSIDES-DOCUSATE SODIUM 8.6-50 MG PO TABS
2.0000 | ORAL_TABLET | ORAL | Status: DC
  Administered 2019-08-08 – 2019-08-09 (×2): 2 via ORAL
  Filled 2019-08-08 (×2): qty 2

## 2019-08-08 MED ORDER — LIDOCAINE HCL (PF) 1 % IJ SOLN
INTRAMUSCULAR | Status: DC | PRN
  Administered 2019-08-08 (×2): 4 mL via EPIDURAL

## 2019-08-08 MED ORDER — TETANUS-DIPHTH-ACELL PERTUSSIS 5-2.5-18.5 LF-MCG/0.5 IM SUSP: 0.5000 mL | Freq: Once | INTRAMUSCULAR | Status: DC

## 2019-08-08 MED ORDER — COCONUT OIL OIL: 1.0000 "application " | TOPICAL_OIL | Status: DC | PRN

## 2019-08-08 MED ORDER — ONDANSETRON HCL 4 MG/2ML IJ SOLN: 4.0000 mg | INTRAMUSCULAR | Status: DC | PRN

## 2019-08-08 MED ORDER — FENTANYL-BUPIVACAINE-NACL 0.5-0.125-0.9 MG/250ML-% EP SOLN
12.0000 mL/h | EPIDURAL | Status: DC | PRN
  Filled 2019-08-08: qty 250

## 2019-08-08 MED ORDER — ONDANSETRON HCL 4 MG PO TABS: 4.0000 mg | ORAL_TABLET | ORAL | Status: DC | PRN

## 2019-08-08 MED ORDER — OXYTOCIN 40 UNITS IN NORMAL SALINE INFUSION - SIMPLE MED
1.0000 m[IU]/min | INTRAVENOUS | Status: DC
  Administered 2019-08-08: 08:00:00 2 m[IU]/min via INTRAVENOUS

## 2019-08-08 MED ORDER — OXYTOCIN BOLUS FROM INFUSION
500.0000 mL | Freq: Once | INTRAVENOUS | Status: AC
  Administered 2019-08-08: 500 mL via INTRAVENOUS

## 2019-08-08 MED ORDER — SIMETHICONE 80 MG PO CHEW
80.0000 mg | CHEWABLE_TABLET | ORAL | Status: DC | PRN
  Administered 2019-08-09: 80 mg via ORAL

## 2019-08-08 MED ORDER — OXYTOCIN 40 UNITS IN NORMAL SALINE INFUSION - SIMPLE MED
2.5000 [IU]/h | INTRAVENOUS | Status: DC
  Filled 2019-08-08: qty 1000

## 2019-08-08 NOTE — Anesthesia Procedure Notes (Signed)
Epidural Patient location during procedure: OB Start time: 08/08/2019 12:30 PM End time: 08/08/2019 12:38 PM  Staffing Anesthesiologist: Josephine Igo, MD Performed: anesthesiologist   Preanesthetic Checklist Completed: patient identified, site marked, surgical consent, pre-op evaluation, timeout performed, IV checked, risks and benefits discussed and monitors and equipment checked  Epidural Patient position: sitting Prep: site prepped and draped and DuraPrep Patient monitoring: continuous pulse ox and blood pressure Approach: midline Location: L3-L4 Injection technique: LOR air  Needle:  Needle type: Tuohy  Needle gauge: 17 G Needle length: 9 cm and 9 Needle insertion depth: 5 cm cm Catheter type: closed end flexible Catheter size: 19 Gauge Catheter at skin depth: 10 cm Test dose: negative and Other  Assessment Events: blood not aspirated, injection not painful, no injection resistance, negative IV test and no paresthesia  Additional Notes Patient identified. Risks and benefits discussed including failed block, incomplete  Pain control, post dural puncture headache, nerve damage, paralysis, blood pressure Changes, nausea, vomiting, reactions to medications-both toxic and allergic and post Partum back pain. All questions were answered. Patient expressed understanding and wished to proceed. Sterile technique was used throughout procedure. Epidural site was Dressed with sterile barrier dressing. No paresthesias, signs of intravascular injection Or signs of intrathecal spread were encountered.  Patient was more comfortable after the epidural was dosed. Please see RN's note for documentation of vital signs and FHR which are stable. Reason for block:procedure for pain

## 2019-08-08 NOTE — Progress Notes (Signed)
Kendra Baird is a 28 y.o. G2P1001 at [redacted]w[redacted]d by ultrasound admitted for induction of labor due to Elective at term.  Subjective: S/p epidural  Objective: BP 112/82   Pulse 84   Temp 98.1 F (36.7 C) (Oral)   Resp 18   Ht 5\' 7"  (1.702 m)   Wt 109.3 kg   LMP 09/20/2018   SpO2 99%   BMI 37.75 kg/m  No intake/output data recorded. No intake/output data recorded.  FHT:  FHR: 140 bpm, variability: moderate,  accelerations:  Present,  decelerations:  Absent UC:   regular, every 3 minutes SVE:   Dilation: (P) 3 Effacement (%): (P) 50 Station: (P) -3 Exam by:: (P) Kylee Nardozzi Controlled AROM, clear fluid, head well applied to cx   Labs: Lab Results  Component Value Date   WBC 8.2 08/08/2019   HGB 12.8 08/08/2019   HCT 37.5 08/08/2019   MCV 90.8 08/08/2019   PLT 177 08/08/2019    Assessment / Plan: Induction of labor due to term with favorable cervix,  progressing well on pitocin  Labor: Progressing normally  Fetal Wellbeing:  Category I Pain Control:  Epidural I/D:  n/a Anticipated MOD:  NSVD  [name] 08/08/2019, 12:51 PM

## 2019-08-08 NOTE — H&P (Signed)
Alexee Delsanto is a 28 y.o. G2P1001 at [redacted]w[redacted]d presenting for IOL. Pt notes occasional contractions . Good fetal movement, No vaginal bleeding, not leaking fluid.  Pt admitted this am, started on pitocin. AROM by Dr. Benjie Karvonen, s/p epidural  Notes resting comfortably at this time.   [facility] at [place] since 7 wks - Dated by LMP c/w 7 wk u/s - marginal CI, nl fetal growth - breech at 36 wks, presented for version and was vtx, ensure fetal position with IOL - obesity, 19# wt gain   Prenatal Transfer Tool  Maternal Diabetes: No Genetic Screening: Normal Maternal Ultrasounds/Referrals: Other: Fetal Ultrasounds or other Referrals:  None Maternal Substance Abuse:  No Significant Maternal Medications:  None Significant Maternal Lab Results: Group B Strep negative             OB History    Gravida  2   Para  1   Term  1   Preterm      AB      Living  1     SAB      TAB      Ectopic      Multiple  0   Live Births  1              Past Medical History:  Diagnosis Date  . Headache   . Hx of varicella   . Migraine         Past Surgical History:  Procedure Laterality Date  . KIDNEY STONE SURGERY    . MOLE REMOVAL    . TONSILLECTOMY     Family History: family history includes CVA in her father; Cancer in her mother; Cancer (age of onset: 43) in her father; Diabetes in her father; Heart attack in her father; Heart disease in her paternal grandfather; Hypertension in her father; Renal Disease in her father; Varicose Veins in her brother and mother. Social History:  reports that she has never smoked. She has never used smokeless tobacco. She reports that she does not drink alcohol or use drugs.  Review of Systems - Negative except discomfort of pregnancy    Physical Exam:  Vitals:   08/08/19 1502 08/08/19 1530 08/08/19 1531 08/08/19 1602  BP: 119/66  118/71 122/78  Pulse: 68  71 72  Resp:      Temp:  98.2 F (36.8 C)    TempSrc:   Oral    SpO2:      Weight:      Height:        Gen: well appearing, no distress, resting comfortably Back: no CVAT Abd: gravid, NT, no RUQ pain LE: Trace edema, equal bilaterally, non-tender Toco: Every 2 to 3 minutes FH: baseline 130s, accelerations present, no deceleratons, 10 beat variability Cervix: 4 to 5 cm, 80% effaced, vertex -3 asynclitic, ruptured  Prenatal labs: ABO, Rh: --/--/O POS, O POS (07/25 0730) Antibody: NEG (07/25 0730) Rubella: Immune (01/24 0000) RPR: Nonreactive (01/24 0000)  HBsAg: Negative (01/24 0000)  HIV: Non-reactive (01/24 0000)  GBS:   negative 1 hr Glucola 93            Genetic screening nl quad Anatomy US normal   Assessment/Plan: 28 y.o. G2P1001 at [redacted]w[redacted]d - term preg, proceed with elective IOL. Plan pitocin 2x2, continue to increase Pitocin, status post rupture of membranes GBS neg -Comfortable with epidural  Ala Dach 08/07/2019, 11:10 PM  Ala Dach 08/08/2019 4:31 PM

## 2019-08-08 NOTE — Anesthesia Preprocedure Evaluation (Signed)
Anesthesia Evaluation  Patient identified by MRN, date of birth, ID band Patient awake    Reviewed: Allergy & Precautions, NPO status , Patient's Chart, lab work & pertinent test results  Airway Mallampati: II  TM Distance: >3 FB Neck ROM: Full    Dental no notable dental hx. (+) Teeth Intact   Pulmonary neg pulmonary ROS,    Pulmonary exam normal breath sounds clear to auscultation       Cardiovascular negative cardio ROS Normal cardiovascular exam Rhythm:Regular Rate:Normal     Neuro/Psych  Headaches, negative psych ROS   GI/Hepatic Neg liver ROS, GERD  ,  Endo/Other  Obesity   Renal/GU negative Renal ROS  negative genitourinary   Musculoskeletal negative musculoskeletal ROS (+)   Abdominal (+) + obese,   Peds  Hematology negative hematology ROS (+)   Anesthesia Other Findings   Reproductive/Obstetrics (+) Pregnancy                             Anesthesia Physical Anesthesia Plan  ASA: II  Anesthesia Plan: Epidural   Post-op Pain Management:    Induction:   PONV Risk Score and Plan:   Airway Management Planned: Natural Airway  Additional Equipment:   Intra-op Plan:   Post-operative Plan:   Informed Consent: I have reviewed the patients History and Physical, chart, labs and discussed the procedure including the risks, benefits and alternatives for the proposed anesthesia with the patient or authorized representative who has indicated his/her understanding and acceptance.       Plan Discussed with: Anesthesiologist  Anesthesia Plan Comments:         Anesthesia Quick Evaluation

## 2019-08-09 ENCOUNTER — Other Ambulatory Visit: Payer: Self-pay

## 2019-08-09 NOTE — Lactation Note (Signed)
This note was copied from a baby's chart. Lactation Consultation Note  Patient Name: Kendra Baird QBHAL'P Date: 08/09/2019    [facility] entered room. Lights off mom resting with infant on chest.  LC asked how things were going.  Mom states that infant is cluster feeding every 30 minutes right now.  She is not sore, feels like the baby is latching very well and deeply.  Mom is hearing swallows when infant is feeding.  LC asks if there is anything she needs right now.  Mom denies needs at this time.  LC does encourage mom to call out if she has questions or needs further assistance when infant wakes.  BF basics reviewed with mom; at least 8-12 feeds in 24 hours, good pillow support, positions that support newborn neck and head during latch and feeding, and breast massage and compression during feeds.    Maternal Data    Feeding    LATCH Score                   Interventions    Lactation Tools Discussed/Used     Consult Status      Ferne Coe [facility] 08/09/2019, 11:07 PM

## 2019-08-09 NOTE — Anesthesia Postprocedure Evaluation (Signed)
Anesthesia Post Note  Patient: Kendra Baird  Procedure(s) Performed: AN AD HOC LABOR EPIDURAL     Patient location during evaluation: Mother Baby Anesthesia Type: Epidural Level of consciousness: awake and alert, awake and oriented Pain management: pain level controlled Vital Signs Assessment: post-procedure vital signs reviewed and stable Respiratory status: spontaneous breathing and respiratory function stable Cardiovascular status: blood pressure returned to baseline Postop Assessment: no headache, epidural receding, patient able to bend at knees, adequate PO intake, no backache, no apparent nausea or vomiting and able to ambulate Anesthetic complications: no    Last Vitals:  Vitals:   08/09/19 0148 08/09/19 0523  BP: 109/70 125/63  Pulse: 71 79  Resp: 16 16  Temp: 36.9 C 36.7 C  SpO2:      Last Pain:  Vitals:   08/09/19 0710  TempSrc:   PainSc: 0-No pain   Pain Goal: Patients Stated Pain Goal: 3 (08/09/19 0339)                 Bufford Spikes

## 2019-08-09 NOTE — Lactation Note (Signed)
This note was copied from a baby's chart. Lactation Consultation Note Baby 49 hrs old. Mom stated baby BF great several times. Baby had a long sleepy spell but BF for 30 min. Recently. Mom denies painful latch to everted nipples. Mom BF her now 28 yr old for 6 weeks exclusively, then pumped up to 43 months old after returning to work at 23 weeks. Reviewed newborn behavior, STS, I&O, cluster feeding. Encouraged mom to call for assistance or questions. Lactation brochure given.  Patient Name: Kendra Baird CWCBJ'S Date: 08/09/2019 Reason for consult: Initial assessment;Term   Maternal Data Has patient been taught Hand Expression?: Yes Does the patient have breastfeeding experience prior to this delivery?: Yes  Feeding Feeding Type: Breast Fed  LATCH Score       Type of Nipple: Everted at rest and after stimulation  Comfort (Breast/Nipple): Soft / non-tender  Hold (Positioning): No assistance needed to correctly position infant at breast.     Interventions Interventions: Breast feeding basics reviewed  Lactation Tools Discussed/Used WIC Program: No   Consult Status Consult Status: Follow-up Date: 08/10/19 Follow-up type: In-patient    Theodoro Kalata 08/09/2019, 6:44 AM

## 2019-08-09 NOTE — Progress Notes (Signed)
Patient ID: Kendra Baird, female   DOB: 1991-11-27, 28 y.o.   MRN: [id number] PPD # 1 S/P NSVD, elective IOL  Live born female  Birth Weight: 7 lb 9.7 oz (3450 g) APGAR: 8, 9  Newborn Delivery   Birth date/time: 08/08/2019 17:37:00 Delivery type: Vaginal, Spontaneous     Baby name: Glennon Mac Delivering provider: Aloha Gell  Episiotomy:None   Lacerations:3rd degree;Perineal   circumcision declines  Feeding: breast  Pain control at delivery: Epidural   S:  Reports feeling sore but well.             Tolerating po/ No nausea or vomiting             Bleeding is light             Pain controlled with ibuprofen (OTC)             Up ad lib / ambulatory / voiding without difficulties   O:  A & O x 3, in no apparent distress              VS:  Vitals:   08/08/19 2030 08/08/19 2134 08/09/19 0148 08/09/19 0523  BP: 122/78 124/87 109/70 125/63  Pulse: 85 82 71 79  Resp: 18 16 16 16   Temp: 98.6 F (37 C) 98.6 F (37 C) 98.4 F (36.9 C) 98.1 F (36.7 C)  TempSrc: Oral Oral Oral Oral  SpO2:      Weight:      Height:        LABS:  Recent Labs    08/08/19 0740  WBC 8.2  HGB 12.8  HCT 37.5  PLT 177    Blood type: --/--/O POS (08/12 0740)  Rubella: Immune (01/24 0000)   I&O: I/O last 3 completed shifts: In: 1243.9 [I.V.:1243.9] Out: 1874 [Urine:1800; Blood:74]          No intake/output data recorded.  Vaccines: TDaP UTD         Flu    UTD   Lungs: Clear and unlabored  Heart: regular rate and rhythm / no murmurs  Abdomen: soft, non-tender, non-distended             Fundus: firm, non-tender, U-1  Perineum: repair intact, no edema  Lochia: small  Extremities: trace pedal edema, no calf pain or tenderness    A/P: PPD # 1 28 y.o., [id number]   Principal Problem:   Postpartum care following vaginal delivery 8/12 Active Problems:   Third degree perineal laceration  - strict bowel regimen, stool softeners  Doing well - stable status  Routine post partum  orders  Anticipate discharge tomorrow    Juliene Pina, MSN, CNM 08/09/2019, 9:11 AM

## 2019-08-10 MED ORDER — BENZOCAINE-MENTHOL 20-0.5 % EX AERO: 1.0000 "application " | INHALATION_SPRAY | CUTANEOUS | Status: DC | PRN

## 2019-08-10 MED ORDER — COCONUT OIL OIL: 1.0000 "application " | TOPICAL_OIL | 0 refills | Status: DC | PRN

## 2019-08-10 MED ORDER — IBUPROFEN 600 MG PO TABS: 600.0000 mg | ORAL_TABLET | Freq: Four times a day (QID) | ORAL | 0 refills | Status: DC

## 2019-08-10 MED ORDER — OXYCODONE HCL 5 MG PO TABS: 5.0000 mg | ORAL_TABLET | Freq: Four times a day (QID) | ORAL | 0 refills | Status: DC | PRN

## 2019-08-10 NOTE — Discharge Summary (Signed)
Obstetric Discharge Summary Reason for Admission: induction of labor Prenatal Procedures: ultrasound Intrapartum Procedures: spontaneous vaginal delivery and epidural Postpartum Procedures: none Complications-Operative and Postpartum: 3rd degree perineal laceration Hemoglobin  Date Value Ref Range Status  08/08/2019 12.8 12.0 - 15.0 g/dL Final   HCT  Date Value Ref Range Status  08/08/2019 37.5 36.0 - 46.0 % Final    Physical Exam:  General: alert, cooperative and no distress Lochia: appropriate Uterine Fundus: firm Incision: healing well DVT Evaluation: No cords or calf tenderness. No significant calf/ankle edema.  Discharge Diagnoses: Principal Problem:   Postpartum care following vaginal delivery 8/12 Active Problems:   Third degree perineal laceration    Discharge Information: Date: 08/10/2019 Activity: pelvic rest Diet: routine Medications:  Allergies as of 08/10/2019   No Known Allergies     Medication List    TAKE these medications   acetaminophen 500 MG tablet Commonly known as: TYLENOL Take 1,000 mg by mouth every 6 (six) hours as needed for mild pain or headache.   benzocaine-Menthol 20-0.5 % Aero Commonly known as: DERMOPLAST Apply 1 application topically as needed for irritation (perineal discomfort).   coconut oil Oil Apply 1 application topically as needed.   ibuprofen 600 MG tablet Commonly known as: ADVIL Take 1 tablet (600 mg total) by mouth every 6 (six) hours.   magnesium gluconate 500 MG tablet Commonly known as: MAGONATE Take 500 mg by mouth 2 (two) times daily.   oxyCODONE 5 MG immediate release tablet Commonly known as: Oxy IR/ROXICODONE Take 1 tablet (5 mg total) by mouth every 6 (six) hours as needed (pain scale 4-7).   prenatal multivitamin Tabs tablet Take 1 tablet by mouth daily at 12 noon.            Discharge Care Instructions  (From admission, onward)         Start     Ordered   08/10/19 0000  Discharge wound  care:    Comments: Sitz baths 2 times /day with warm water x 1 week. May add herbals: 1 ounce dried comfrey leaf* 1 ounce calendula flowers 1 ounce lavender flowers 1/2 ounce dried uva ursi leaves 1/2 ounce witch hazel blossoms (if you can find them) 1/2 ounce dried sage leaf 1/2 cup sea salt Directions: Bring 2 quarts of water to a boil. Turn off heat, and place 1 ounce (approximately 1 large handful) of the above mixed herbs (not the salt) into the pot. Steep, covered, for 30 minutes.  Strain the liquid well with a fine mesh strainer, and discard the herb material. Add 2 quarts of liquid to the tub, along with the 1/2 cup of salt. This medicinal liquid can also be made into compresses and peri-rinses.   08/10/19 0858         Condition: stable Instructions: refer to practice specific booklet Discharge to: home Follow-up Information    Obgyn, [facility]. Schedule an appointment as soon as possible for a visit in 6 week(s).   Contact information: [place] [address] [phone]           Newborn Data: Live born female Glennon Mac Birth Weight: 7 lb 9.7 oz (3450 g) APGAR: 68, 9  Newborn Delivery   Birth date/time: 08/08/2019 17:37:00 Delivery type: Vaginal, Spontaneous      Home with mother.  Juliene Pina, CNM 08/10/2019, 8:21 AM

## 2019-08-10 NOTE — Lactation Note (Signed)
This note was copied from a baby's chart. Lactation Consultation Note  Patient Name: Kendra Baird TDVVO'[id number] Date: 08/10/2019 Reason for consult: Follow-up assessment;Term;Infant weight loss Baby is 40 hours old / 4 % weight loss/ Total Bilirubin at 40 hours - 8.3. Per mom the baby last fed at  8 am to 9:30 am - cluster feeding.  LC updated the doc flow sheets per mom .  Per mom the baby seems to favor the right breast over the left like my 1st baby.  LC recommended prior to feeding - breast massage, hand express, pre-pump to prime the milk ducts , warm moist heat too.  Continue to rotate positions between at least 2 .  Mom denies sore nipples/ sore nipple and engorgement prevention and tx reviewed. Per mom has DEBP at home.  LC stressed the importance of STS feedings until the baby is back to birth  Weight and gaining steadily. Mom aware of the Lactation resources after D/C at [facility].     Maternal Data    Feeding    LATCH Score                   Interventions Interventions: Breast feeding basics reviewed  Lactation Tools Discussed/Used Pump Review: Milk Storage   Consult Status Consult Status: Complete Date: 08/10/19    Myer Haff 08/10/2019, 10:28 AM

## 2019-09-28 NOTE — Progress Notes (Signed)
Virtual Visit via Video Note The purpose of this virtual visit is to provide medical care while limiting exposure to the novel coronavirus.    Consent was obtained for video visit:  Yes Answered questions that patient had about telehealth interaction:  Yes I discussed the limitations, risks, security and privacy concerns of performing an evaluation and management service by telemedicine. I also discussed with the patient that there may be a patient responsible charge related to this service. The patient expressed understanding and agreed to proceed.  Pt location: Home Physician Location: office Name of referring provider:  Kennon Rounds, NP I connected with [name] at patients initiation/request on 10/01/2019 at  1:30 PM EDT by video enabled telemedicine application and verified that I am speaking with the correct person using two identifiers. Pt MRN:  [id number] Pt DOB:  Sep 12, 1991 Video Participants:  Kendra Baird   History of Present Illness:  Kendra Baird is a 28 year old right-handed female who follows up for migraine.  UPDATE: Medical management was put on-hold in December after she found out that she was pregnant.  She had her baby boy 8 weeks ago.  She did well while pregnant.  She has had a couple of headaches.  She is breastfeeding and plans to breastfeed at least 6 months if not longer.    Current NSAIDS:  ibuprofen Current analgesics:  Tylenol Current triptans:  None Current ergotamine:  None Current anti-emetic:  None Current muscle relaxants:  None  Current anti-anxiolytic:  None Current sleep aide:  None Current Antihypertensive medications:  None Current Antidepressant medications:  None Current Anticonvulsant medications:  None Current anti-CGRP:  None Current Vitamins/Herbal/Supplements:  magnesium gluconate 500mg  twice daily Current Antihistamines/Decongestants:  None Other therapy:  None  Caffeine:  Rarely drinks coffee Diet:  Drinks 1 gallon of  water daily.  Drinks little soda. Exercise:  Runs, light free weights Depression:  no; Anxiety:  no Other pain:  no Sleep hygiene:  good  HISTORY: Onset:  She has had migraines since highschool.  She reports a few concussions in highschool basketball.  Severe in college and then improved.   Location:  Right peri-orbital and then radiates over the right side of her head to the shoulder. Quality:  Pressure/non-throbbing Initial intensity:  4-5/10 (8.5/10 debilitating).  She denies new headache, thunderclap headache Aura:  No Prodrome:  No Postdrome:  No Associated symptoms:  Loss of appetite, sometimes nausea, photophobia or phonophobia.  She denies associated unilateral numbness or weakness. Initial Duration:  All day Initial Frequency:  Usually every other month (debilitating one every 4 months) Initial Frequency of abortive medication: every other month Triggers:  Certain prenatal vitamins Relieving factors:  Sleep, caffeine, hydration Activity:  No  Headaches improved during her first pregnancy in 2017.  They began to become more frequent about 1 1/2 years after she gave birth to her first son.   On 10/04/2018 she had another typical debilitating migraine.  However, she had passed out, which was different.  She went to the ED for evaluation and treatment of her headache.  She received a headache cocktail of Reglan and Benadryl.  CT of the head without contrast was personally reviewed and was unremarkable.  She hasn't had a migraine since then.    Past NSAIDS:  Ibuprofen, naproxen Past analgesics:  Tylenol, Excedrin Past abortive triptans:  Maybe sumatriptan Past abortive ergotamine:  None Past muscle relaxants:  None Past anti-emetic:  None  Past antihypertensive medications:  None Past antidepressant medications:  None  Past anticonvulsant medications:  None Past anti-CGRP:  None Past vitamins/Herbal/Supplements:  None Past antihistamines/decongestants:  None Other past  therapies:  None  Family history of headache:  no   Past Medical History: Past Medical History:  Diagnosis Date  . Headache   . Hx of varicella   . Migraine     Medications: Outpatient Encounter Medications as of 10/01/2019  Medication Sig  . acetaminophen (TYLENOL) 500 MG tablet Take 1,000 mg by mouth every 6 (six) hours as needed for mild pain or headache.  . benzocaine-Menthol (DERMOPLAST) 20-0.5 % AERO Apply 1 application topically as needed for irritation (perineal discomfort).  . coconut oil OIL Apply 1 application topically as needed.  Marland Kitchen. ibuprofen (ADVIL) 600 MG tablet Take 1 tablet (600 mg total) by mouth every 6 (six) hours.  . magnesium gluconate (MAGONATE) 500 MG tablet Take 500 mg by mouth 2 (two) times daily.  Marland Kitchen. oxyCODONE (OXY IR/ROXICODONE) 5 MG immediate release tablet Take 1 tablet (5 mg total) by mouth every 6 (six) hours as needed (pain scale 4-7).  . Prenatal Vit-Fe Fumarate-FA (PRENATAL MULTIVITAMIN) TABS tablet Take 1 tablet by mouth daily at 12 noon.   No facility-administered encounter medications on file as of 10/01/2019.     Allergies: No Known Allergies  Family History: Family History  Problem Relation Age of Onset  . Varicose Veins Mother   . Cancer Mother   . Diabetes Father   . Cancer Father [id number]  . Hypertension Father   . Heart attack Father   . CVA Father   . Renal Disease Father   . Varicose Veins Brother   . Heart disease Paternal Grandfather     Social History: Social History   Socioeconomic History  . Marital status: Married    Spouse name: Acupuncturistcott  . Number of children: 1  . Years of education: Not on file  . Highest education level: Bachelor's degree (e.g., BA, AB, BS)  Occupational History  . Occupation: Reservations    Comment: [employer]  Social Needs  . Financial resource strain: Not hard at all  . Food insecurity    Worry: Never true    Inability: Never true  . Transportation needs    Medical: No    Non-medical:  Not on file  Tobacco Use  . Smoking status: Never Smoker  . Smokeless tobacco: Never Used  Substance and Sexual Activity  . Alcohol use: No  . Drug use: No  . Sexual activity: Not Currently  Lifestyle  . Physical activity    Days per week: Not on file    Minutes per session: Not on file  . Stress: To some extent  Relationships  . Social Musicianconnections    Talks on phone: Not on file    Gets together: Not on file    Attends religious service: Not on file    Active member of club or organization: Not on file    Attends meetings of clubs or organizations: Not on file    Relationship status: Not on file  . Intimate partner violence    Fear of current or ex partner: No    Emotionally abused: No    Physically abused: No    Forced sexual activity: No  Other Topics Concern  . Not on file  Social History Narrative   Patient is right-handed. She lives with her husband and son in a one level home, with a few steps to enter from garage. She rarely drinks caffeine. She does not  exercise.    Observations/Objective:   Height 5\' 8"  (1.727 m), weight 220 lb (99.8 kg), unknown if currently breastfeeding. No acute distress.  Alert and oriented.  Speech fluent and not dysarthric.  Language intact.  Eyes orthophoric on primary gaze.  Face symmetric.  Assessment and Plan:   Migraine without aura, without status migrainosus, not intractable, currently stable postpartum and breastfeeding  She will follow up with me in 6 months and we will see where she is at in regards to migraines and breastfeeding status.  Follow Up Instructions:    -I discussed the assessment and treatment plan with the patient. The patient was provided an opportunity to ask questions and all were answered. The patient agreed with the plan and demonstrated an understanding of the instructions.   The patient was advised to call back or seek an in-person evaluation if the symptoms worsen or if the condition fails to improve as  anticipated.  Time spent with patient:  11 minutes.     Dudley Major, DO

## 2019-10-01 ENCOUNTER — Encounter: Payer: Self-pay | Admitting: Neurology

## 2019-10-01 ENCOUNTER — Other Ambulatory Visit: Payer: Self-pay

## 2019-10-01 ENCOUNTER — Telehealth (INDEPENDENT_AMBULATORY_CARE_PROVIDER_SITE_OTHER): Payer: No Typology Code available for payment source | Admitting: Neurology

## 2019-10-01 VITALS — Ht 68.0 in | Wt 220.0 lb

## 2019-10-01 DIAGNOSIS — G43009 Migraine without aura, not intractable, without status migrainosus: Secondary | ICD-10-CM

## 2019-10-15 ENCOUNTER — Ambulatory Visit: Payer: No Typology Code available for payment source | Admitting: Neurology

## 2019-10-29 ENCOUNTER — Other Ambulatory Visit: Payer: Self-pay

## 2019-10-29 DIAGNOSIS — Z20822 Contact with and (suspected) exposure to covid-19: Secondary | ICD-10-CM

## 2019-10-31 LAB — NOVEL CORONAVIRUS, NAA: SARS-CoV-2, NAA: NOT DETECTED

## 2019-12-24 ENCOUNTER — Ambulatory Visit: Payer: No Typology Code available for payment source | Attending: Internal Medicine

## 2019-12-24 DIAGNOSIS — U071 COVID-19: Secondary | ICD-10-CM

## 2019-12-24 DIAGNOSIS — R238 Other skin changes: Secondary | ICD-10-CM

## 2019-12-26 LAB — NOVEL CORONAVIRUS, NAA: SARS-CoV-2, NAA: NOT DETECTED

## 2020-02-26 ENCOUNTER — Other Ambulatory Visit: Payer: Self-pay

## 2020-02-26 ENCOUNTER — Encounter (INDEPENDENT_AMBULATORY_CARE_PROVIDER_SITE_OTHER): Payer: Self-pay | Admitting: Family Medicine

## 2020-02-26 ENCOUNTER — Ambulatory Visit (INDEPENDENT_AMBULATORY_CARE_PROVIDER_SITE_OTHER): Payer: 59 | Admitting: Family Medicine

## 2020-02-26 VITALS — BP 118/73 | HR 66 | Temp 97.8°F | Ht 66.0 in | Wt 222.0 lb

## 2020-02-26 DIAGNOSIS — R0602 Shortness of breath: Secondary | ICD-10-CM | POA: Diagnosis not present

## 2020-02-26 DIAGNOSIS — R5383 Other fatigue: Secondary | ICD-10-CM

## 2020-02-26 DIAGNOSIS — G43809 Other migraine, not intractable, without status migrainosus: Secondary | ICD-10-CM | POA: Diagnosis not present

## 2020-02-26 DIAGNOSIS — Z8659 Personal history of other mental and behavioral disorders: Secondary | ICD-10-CM

## 2020-02-26 DIAGNOSIS — Z1331 Encounter for screening for depression: Secondary | ICD-10-CM | POA: Diagnosis not present

## 2020-02-26 DIAGNOSIS — Z8759 Personal history of other complications of pregnancy, childbirth and the puerperium: Secondary | ICD-10-CM

## 2020-02-26 DIAGNOSIS — Z9189 Other specified personal risk factors, not elsewhere classified: Secondary | ICD-10-CM

## 2020-02-26 DIAGNOSIS — Z6835 Body mass index (BMI) 35.0-35.9, adult: Secondary | ICD-10-CM

## 2020-02-26 DIAGNOSIS — Z0289 Encounter for other administrative examinations: Secondary | ICD-10-CM

## 2020-02-26 NOTE — Progress Notes (Signed)
Chief Complaint:   OBESITY Kendra Baird (MR# [id number]) is a 29 y.o. female who presents for evaluation and treatment of obesity and related comorbidities. Current BMI is Body mass index is 35.83 kg/m. Kendra Baird has been struggling with her weight for many years and has been unsuccessful in either losing weight, maintaining weight loss, or reaching her healthy weight goal.  Kendra Baird is currently in the action stage of change and ready to dedicate time achieving and maintaining a healthier weight. Kendra Baird is interested in becoming our patient and working on intensive lifestyle modifications including (but not limited to) diet and exercise for weight loss.  Kendra Baird's husband is in the program.  She has 58-year-old and 34-month-old sons, and is still breastfeeding.  She works outside of the home.  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    Kendra Baird's habits were reviewed today and are as follows: Her family eats meals together, she thinks her family will eat healthier with her, her desired weight loss is 50 pounds, she has been heavy most of her life, she started gaining weight in middle school, her heaviest weight ever was 225 pounds, she craves creamy/cheese, crunchy, lately has had a sweet tooth, she frequently makes poor food choices, she frequently eats larger portions than normal and she struggles with emotional eating.  Depression Screen Kendra Baird's Food and Mood (modified PHQ-9) score was 8.  Depression screen PHQ 2/9 02/26/2020  Decreased Interest 1  Down, Depressed, Hopeless 1  PHQ - 2 Score 2  Altered sleeping 0  Tired, decreased energy 2  Change in appetite 2  Feeling bad or failure about yourself  1  Trouble concentrating 1  Moving slowly or fidgety/restless 0  Suicidal thoughts 0  PHQ-9 Score 8  Difficult doing work/chores Not difficult at all   Subjective:   1. Other fatigue Deisy admits to daytime somnolence and denies waking up still tired. Patent has a  history of symptoms of morning fatigue. Kendra Baird generally gets 6 or 7 hours of sleep per night, and states that she has generally restful sleep. Snoring is not present. Apneic episodes are not present. Epworth Sleepiness Score is 15.  2. SOB (shortness of breath) on exertion Kendra Baird notes increasing shortness of breath with exercising and seems to be worsening over time with weight gain. She notes getting out of breath sooner with activity than she used to. This has gotten worse recently. Ailani denies shortness of breath at rest or orthopnea.  3. Other migraine without status migrainosus, not intractable Kendra Baird is followed by Dr. Everlena Cooper for migraines.  She has an aura with her migraines and they are thought to be hormonal.    4. History of postpartum depression Kendra Baird is currently taking the minipill.    5. Depression screen Kendra Baird was screened for depression as part of her new patient workup.  6. At risk for constipation Kendra Baird is at increased risk for constipation due to inadequate water intake, changes in diet, and/or use of medications such as GLP1 agonists. Kendra Baird denies hard, infrequent stools currently.   Assessment/Plan:   1. Other fatigue Kendra Baird does feel that her weight is causing her energy to be lower than it should be. Fatigue Kendra be related to obesity, depression or many other causes. Labs will be ordered, and in the meanwhile, Kendra Baird will focus on self care including making healthy food choices, increasing physical activity and focusing on stress reduction.  Orders - EKG 12-Lead  2. SOB (shortness of breath)  on exertion Kendra Baird does feel that she gets out of breath more easily that she used to when she exercises. Kendra Baird's shortness of breath appears to be obesity related and exercise induced. She has agreed to work on weight loss and gradually increase exercise to treat her exercise induced shortness of breath. Will continue to monitor closely.  3. Other migraine without  status migrainosus, not intractable Monitor while breastfeeding.  4. History of postpartum depression Reviewed the importance of taking minipill regularly and on time.  Consider IUD until she wants to conceive again due to history of postpartum depression.  5. Depression screen Kendra Baird had a positive depression screening. Depression is commonly associated with obesity and often results in emotional eating behaviors. We will monitor this closely and work on CBT to help improve the non-hunger eating patterns. Referral to Psychology Kendra be required if no improvement is seen as she continues in our clinic.  PHQ-9 was 8 today.  6. At risk for constipation Kendra Baird was given approximately 15 minutes of counseling today regarding prevention of constipation. She was encouraged to increase water and fiber intake.   7. Class 2 severe obesity with serious comorbidity and body mass index (BMI) of 35.0 to 35.9 in adult, unspecified obesity type (HCC) Kendra Baird is currently in the action stage of change and her goal is to continue with weight loss efforts. I recommend Kendra Baird begin the structured treatment plan as follows:  She has agreed to the Category 3 Plan.  Exercise goals: No exercise has been prescribed at this time.   Behavioral modification strategies: increasing lean protein intake, decreasing simple carbohydrates and increasing vegetables.  She was informed of the importance of frequent follow-up visits to maximize her success with intensive lifestyle modifications for her multiple health conditions. She was informed we would discuss her lab results at her next visit unless there is a critical issue that needs to be addressed sooner. Kendra Baird agreed to keep her next visit at the agreed upon time to discuss these results.  Objective:   Blood pressure 118/73, pulse 66, temperature 97.8 F (36.6 C), temperature source Oral, height 5\' 6"  (1.676 m), weight 222 lb (100.7 kg), last menstrual period  10/28/2018, SpO2 98 %, unknown if currently breastfeeding. Body mass index is 35.83 kg/m.  EKG: Normal sinus rhythm, rate 72 bpm.  Indirect Calorimeter completed today shows a VO2 of 304 and a REE of 2114.  Her calculated basal metabolic rate is 2115 thus her basal metabolic rate is better than expected.  General: Cooperative, alert, well developed, in no acute distress. HEENT: Conjunctivae and lids unremarkable. Cardiovascular: Regular rhythm.  Lungs: Normal work of breathing. Neurologic: No focal deficits.   Lab Results  Component Value Date   CREATININE 0.81 10/04/2018   BUN 12 10/04/2018   NA 137 10/04/2018   K 4.7 10/04/2018   CL 105 10/04/2018   CO2 26 10/04/2018   Lab Results  Component Value Date   WBC 8.2 08/08/2019   HGB 12.8 08/08/2019   HCT 37.5 08/08/2019   MCV 90.8 08/08/2019   PLT 177 08/08/2019   Attestation Statements:   This is the patient's first visit at [facility]. The patient's NEW PATIENT PACKET was reviewed at length. Included in the packet: current and past health history, medications, allergies, ROS, gynecologic history (women only), surgical history, family history, social history, weight history, weight loss surgery history (for those that have had weight loss surgery), nutritional evaluation, mood and food questionnaire, PHQ9, Epworth questionnaire, sleep  habits questionnaire, patient life and health improvement goals questionnaire. These will all be scanned into the patient's chart under media.   During the visit, I independently reviewed the patient's EKG, bioimpedance scale results, and indirect calorimeter results. I used this information to tailor a meal plan for the patient that will help her to lose weight and will improve her obesity-related conditions going forward. I performed a medically necessary appropriate examination and/or evaluation. I discussed the assessment and treatment plan with the patient. The patient was provided  an opportunity to ask questions and all were answered. The patient agreed with the plan and demonstrated an understanding of the instructions. Labs were ordered at this visit and will be reviewed at the next visit unless more critical results need to be addressed immediately. Clinical information was updated and documented in the EMR.   I, Water quality scientist, CMA, am acting as Location manager for [employer], DO.  I have reviewed the above documentation for accuracy and completeness, and I agree with the above. Briscoe Deutscher, DO

## 2020-02-26 NOTE — Addendum Note (Signed)
Addended by: Len Blalock on: 02/26/2020 05:03 PM   Modules accepted: Orders

## 2020-02-29 LAB — COMPREHENSIVE METABOLIC PANEL
ALT: 11 IU/L (ref 0–32)
AST: 15 IU/L (ref 0–40)
Albumin/Globulin Ratio: 2 (ref 1.2–2.2)
Albumin: 4.7 g/dL (ref 3.8–4.8)
Alkaline Phosphatase: 76 IU/L (ref 39–117)
BUN/Creatinine Ratio: 16 (ref 9–23)
BUN: 13 mg/dL (ref 6–20)
Bilirubin Total: 0.7 mg/dL (ref 0.0–1.2)
CO2: 19 mmol/L — ABNORMAL LOW (ref 20–29)
Calcium: 9.3 mg/dL (ref 8.7–10.2)
Chloride: 102 mmol/L (ref 96–106)
Creatinine, Ser: 0.8 mg/dL (ref 0.57–1.00)
GFR calc Af Amer: 114 mL/min/{1.73_m2} (ref >59)
GFR calc non Af Amer: 99 mL/min/{1.73_m2} (ref >59)
Globulin, Total: 2.4 g/dL (ref 1.5–4.5)
Glucose: 82 mg/dL (ref 65–99)
Potassium: 4.3 mmol/L (ref 3.5–5.2)
Sodium: 141 mmol/L (ref 134–144)
Total Protein: 7.1 g/dL (ref 6.0–8.5)

## 2020-02-29 LAB — INSULIN, RANDOM: INSULIN: 2.5 u[IU]/mL — ABNORMAL LOW (ref 2.6–24.9)

## 2020-02-29 LAB — CBC WITH DIFFERENTIAL/PLATELET
Basophils Absolute: 0 10*3/uL (ref 0.0–0.2)
Basos: 1 %
EOS (ABSOLUTE): 0.1 10*3/uL (ref 0.0–0.4)
Eos: 1 %
Hemoglobin: 14.4 g/dL (ref 11.1–15.9)
Immature Grans (Abs): 0 10*3/uL (ref 0.0–0.1)
Immature Granulocytes: 0 %
Lymphocytes Absolute: 2.4 10*3/uL (ref 0.7–3.1)
Lymphs: 32 %
MCH: 30.5 pg (ref 26.6–33.0)
MCHC: 34.5 g/dL (ref 31.5–35.7)
MCV: 88 fL (ref 79–97)
Monocytes Absolute: 0.4 10*3/uL (ref 0.1–0.9)
Monocytes: 6 %
Neutrophils Absolute: 4.6 10*3/uL (ref 1.4–7.0)
Neutrophils: 60 %
Platelets: 214 10*3/uL (ref 150–450)
RBC: 4.72 x10E6/uL (ref 3.77–5.28)
RDW: 11.6 % — ABNORMAL LOW (ref 11.7–15.4)
WBC: 7.5 10*3/uL (ref 3.4–10.8)

## 2020-02-29 LAB — ANEMIA PANEL
Ferritin: 71 ng/mL (ref 15–150)
Folate, Hemolysate: 418 ng/mL
Folate, RBC: 1002 ng/mL (ref >498)
Hematocrit: 41.7 % (ref 34.0–46.6)
Iron Saturation: 27 % (ref 15–55)
Iron: 87 ug/dL (ref 27–159)
Retic Ct Pct: 1.3 % (ref 0.6–2.6)
Total Iron Binding Capacity: 319 ug/dL (ref 250–450)
UIBC: 232 ug/dL (ref 131–425)
Vitamin B-12: 494 pg/mL (ref 232–1245)

## 2020-02-29 LAB — HEMOGLOBIN A1C
Est. average glucose Bld gHb Est-mCnc: 100 mg/dL
Hgb A1c MFr Bld: 5.1 % (ref 4.8–5.6)

## 2020-02-29 LAB — TSH: TSH: 0.535 u[IU]/mL (ref 0.450–4.500)

## 2020-02-29 LAB — LIPID PANEL WITH LDL/HDL RATIO
Cholesterol, Total: 166 mg/dL (ref 100–199)
HDL: 68 mg/dL (ref >39)
LDL Chol Calc (NIH): 89 mg/dL (ref 0–99)
LDL/HDL Ratio: 1.3 ratio (ref 0.0–3.2)
Triglycerides: 41 mg/dL (ref 0–149)
VLDL Cholesterol Cal: 9 mg/dL (ref 5–40)

## 2020-02-29 LAB — VITAMIN D 25 HYDROXY (VIT D DEFICIENCY, FRACTURES): Vit D, 25-Hydroxy: 30.8 ng/mL (ref 30.0–100.0)

## 2020-02-29 LAB — T4, FREE: Free T4: 1.51 ng/dL (ref 0.82–1.77)

## 2020-02-29 LAB — T3: T3, Total: 236 ng/dL — ABNORMAL HIGH (ref 71–180)

## 2020-03-11 ENCOUNTER — Encounter (INDEPENDENT_AMBULATORY_CARE_PROVIDER_SITE_OTHER): Payer: Self-pay | Admitting: Family Medicine

## 2020-03-11 ENCOUNTER — Other Ambulatory Visit: Payer: Self-pay

## 2020-03-11 ENCOUNTER — Ambulatory Visit (INDEPENDENT_AMBULATORY_CARE_PROVIDER_SITE_OTHER): Payer: 59 | Admitting: Family Medicine

## 2020-03-11 VITALS — BP 109/72 | HR 55 | Temp 98.1°F | Ht 66.0 in | Wt 218.0 lb

## 2020-03-11 DIAGNOSIS — Z8659 Personal history of other mental and behavioral disorders: Secondary | ICD-10-CM

## 2020-03-11 DIAGNOSIS — R638 Other symptoms and signs concerning food and fluid intake: Secondary | ICD-10-CM | POA: Diagnosis not present

## 2020-03-11 DIAGNOSIS — Z9189 Other specified personal risk factors, not elsewhere classified: Secondary | ICD-10-CM

## 2020-03-11 DIAGNOSIS — F3289 Other specified depressive episodes: Secondary | ICD-10-CM | POA: Diagnosis not present

## 2020-03-11 DIAGNOSIS — Z8759 Personal history of other complications of pregnancy, childbirth and the puerperium: Secondary | ICD-10-CM | POA: Diagnosis not present

## 2020-03-11 DIAGNOSIS — Z6835 Body mass index (BMI) 35.0-35.9, adult: Secondary | ICD-10-CM

## 2020-03-11 NOTE — Progress Notes (Signed)
Chief Complaint:   OBESITY Kendra Baird is here to discuss her progress with her obesity treatment plan along with follow-up of her obesity related diagnoses. Kendra Baird is on the Category 3 Plan and states she is following her eating plan approximately 90% of the time. Kendra Baird states she has walked 3 miles total in 2 weeks.  Today's visit was #: 2 Starting weight: 222 lbs Starting date: 02/26/2020 Today's weight: 218 lbs Today's date: 03/11/2020 Total lbs lost to date: 4 lbs Total lbs lost since last in-office visit: 4 lbs  Interim History: Kendra Baird is still tracking in [employer]itness[employer].  Averages 1500 calories and over 95 grams of protein.  She had PMS last week and was "ravenous".  She says that portion control has been a big help.  She is still pumping and taking a prenatal vitamin.  Her volume is okay.  She reports that she has been eating more slowly and spacing out foods.  She is drinking 1 gallon of water a day.  She denies constipation.  Subjective:   1. Increased nutritional needs Kendra Baird is taking a prenatal vitamin.  2. History of postpartum depression Kendra Baird says she is in a much better place now and doesn't anticipate this happening again.  3. Other depression, with emotional eating Kendra Baird is struggling with emotional eating and using food for comfort to the extent that it is negatively impacting her health. She has been working on behavior modification techniques to help reduce her emotional eating and has been unsuccessful. She shows no sign of suicidal or homicidal ideations.    4. At risk for constipation Kendra Baird is at increased risk for constipation due to inadequate water intake, changes in diet, and/or use of medications such as GLP1 agonists. Kendra Baird denies hard, infrequent stools currently.   Assessment/Plan:   1. Increased nutritional needs Will continue to monitor.  2. History of postpartum depression Current treatment plan is effective, no change in therapy. Orders and  follow up as documented in patient record.  3. Other depression, with emotional eating Behavior modification techniques were discussed today to help Kendra Baird deal with her emotional/non-hunger eating behaviors.  Orders and follow up as documented in patient record.   4. At risk for constipation Kendra Baird was given approximately 15 minutes of counseling today regarding prevention of constipation. She was encouraged to increase water and fiber intake.   5. Class 2 severe obesity with serious comorbidity and body mass index (BMI) of 35.0 to 35.9 in adult, unspecified obesity type (HCC) Kendra Baird is currently in the action stage of change. As such, her goal is to continue with weight loss efforts. She has agreed to the Category 3 Plan.   Exercise goals: For substantial health benefits, adults should do at least 150 minutes (2 hours and 30 minutes) a week of moderate-intensity, or 75 minutes (1 hour and 15 minutes) a week of vigorous-intensity aerobic physical activity, or an equivalent combination of moderate- and vigorous-intensity aerobic activity. Aerobic activity should be performed in episodes of at least 10 minutes, and preferably, it should be spread throughout the week.  Behavioral modification strategies: increasing high fiber foods.  Running, intervals.  Kendra Baird has agreed to follow-up with our clinic in 2 weeks. She was informed of the importance of frequent follow-up visits to maximize her success with intensive lifestyle modifications for her multiple health conditions.   Objective:   Blood pressure 109/72, pulse (!) 55, temperature 98.1 F (36.7 C), temperature source Oral, height 5\' 6"  (1.676 m), weight 218 lb (  98.9 kg), last menstrual period 11/06/2018, SpO2 98 %, currently breastfeeding. Body mass index is 35.19 kg/m.  General: Cooperative, alert, well developed, in no acute distress. HEENT: Conjunctivae and lids unremarkable. Cardiovascular: Regular rhythm.  Lungs: Normal work of  breathing. Neurologic: No focal deficits.   Lab Results  Component Value Date   CREATININE 0.80 02/26/2020   BUN 13 02/26/2020   NA 141 02/26/2020   K 4.3 02/26/2020   CL 102 02/26/2020   CO2 19 (L) 02/26/2020   Lab Results  Component Value Date   ALT 11 02/26/2020   AST 15 02/26/2020   ALKPHOS 76 02/26/2020   BILITOT 0.7 02/26/2020   Lab Results  Component Value Date   HGBA1C 5.1 02/26/2020   Lab Results  Component Value Date   INSULIN 2.5 (L) 02/26/2020   Lab Results  Component Value Date   TSH 0.535 02/26/2020   Lab Results  Component Value Date   CHOL 166 02/26/2020   HDL 68 02/26/2020   LDLCALC 89 02/26/2020   TRIG 41 02/26/2020   Lab Results  Component Value Date   WBC 7.5 02/26/2020   HGB 14.4 02/26/2020   HCT 41.7 02/26/2020   MCV 88 02/26/2020   PLT 214 02/26/2020   Lab Results  Component Value Date   IRON 87 02/26/2020   TIBC 319 02/26/2020   FERRITIN 71 02/26/2020   Attestation Statements:   Reviewed by clinician on day of visit: allergies, medications, problem list, medical history, surgical history, family history, social history, and previous encounter notes.  I, Water quality scientist, CMA, am acting as Location manager for [employer], DO.  I have reviewed the above documentation for accuracy and completeness, and I agree with the above. Briscoe Deutscher, DO

## 2020-03-25 ENCOUNTER — Ambulatory Visit (INDEPENDENT_AMBULATORY_CARE_PROVIDER_SITE_OTHER): Payer: Commercial Managed Care - PPO | Admitting: Family Medicine

## 2020-03-25 ENCOUNTER — Encounter (INDEPENDENT_AMBULATORY_CARE_PROVIDER_SITE_OTHER): Payer: Self-pay | Admitting: Family Medicine

## 2020-03-25 ENCOUNTER — Other Ambulatory Visit: Payer: Self-pay

## 2020-03-25 VITALS — BP 126/84 | HR 64 | Temp 97.8°F | Ht 66.0 in | Wt 218.0 lb

## 2020-03-25 DIAGNOSIS — Z6835 Body mass index (BMI) 35.0-35.9, adult: Secondary | ICD-10-CM | POA: Diagnosis not present

## 2020-03-25 DIAGNOSIS — R7989 Other specified abnormal findings of blood chemistry: Secondary | ICD-10-CM | POA: Diagnosis not present

## 2020-03-25 DIAGNOSIS — E559 Vitamin D deficiency, unspecified: Secondary | ICD-10-CM | POA: Diagnosis not present

## 2020-03-25 NOTE — Progress Notes (Signed)
Chief Complaint:   OBESITY Kendra Baird is here to discuss her progress with her obesity treatment plan along with follow-up of her obesity related diagnoses. Kendra Baird is on the Category 3 Plan and states she is following her eating plan approximately 90% of the time. Kendra Baird states she is running 2 miles in 20 minutes 1 times per week.  Today's visit was #: 3 Starting weight: 222 lbs Starting date: 02/26/2020 Today's weight: 218 lbs Today's date: 03/25/2020 Total lbs lost to date: 4 lbs Total lbs lost since last in-office visit: 0  Interim History: Kendra Baird says she really loves the program and meal plan.  She says she did some indulgent eating this past week, but otherwise stuck to the plan fairly strictly.  She reports no obstacles in the next few weeks.  She has no hunger or cravings.  For snacks, patient is adding to lunch.  Subjective:   1. Vitamin D deficiency Kendra Baird's Vitamin D level was 30.8 on 02/26/2020. She is currently taking no vitamin D supplement. Endorses fatigue.  2. Abnormal TSH Last T3 was abnormal at 236.  Lab Results  Component Value Date   TSH 0.535 02/26/2020   Assessment/Plan:   1. Vitamin D deficiency Low Vitamin D level contributes to fatigue and are associated with obesity, breast, and colon cancer.   2. Abnormal TSH Will recheck thyroid in 4-6 weeks.  3. Class 2 severe obesity with serious comorbidity and body mass index (BMI) of 35.0 to 35.9 in adult, unspecified obesity type (HCC) Kendra Baird is currently in the action stage of change. As such, her goal is to continue with weight loss efforts. She has agreed to the Category 3 Plan or the Category 4 Plan.   Exercise goals: No exercise has been prescribed at this time.  Behavioral modification strategies: increasing lean protein intake, increasing vegetables, meal planning and cooking strategies, keeping healthy foods in the home and planning for success.  Kendra Baird has agreed to follow-up with our clinic in 2  weeks. She was informed of the importance of frequent follow-up visits to maximize her success with intensive lifestyle modifications for her multiple health conditions.   Objective:   Blood pressure 126/84, pulse 64, temperature 97.8 F (36.6 C), temperature source Oral, height 5\' 6"  (1.676 m), weight 218 lb (98.9 kg), last menstrual period 10/27/2018, SpO2 98 %, currently breastfeeding. Body mass index is 35.19 kg/m.  General: Cooperative, alert, well developed, in no acute distress. HEENT: Conjunctivae and lids unremarkable. Cardiovascular: Regular rhythm.  Lungs: Normal work of breathing. Neurologic: No focal deficits.   Lab Results  Component Value Date   CREATININE 0.80 02/26/2020   BUN 13 02/26/2020   NA 141 02/26/2020   K 4.3 02/26/2020   CL 102 02/26/2020   CO2 19 (L) 02/26/2020   Lab Results  Component Value Date   ALT 11 02/26/2020   AST 15 02/26/2020   ALKPHOS 76 02/26/2020   BILITOT 0.7 02/26/2020   Lab Results  Component Value Date   HGBA1C 5.1 02/26/2020   Lab Results  Component Value Date   INSULIN 2.5 (L) 02/26/2020   Lab Results  Component Value Date   TSH 0.535 02/26/2020   Lab Results  Component Value Date   CHOL 166 02/26/2020   HDL 68 02/26/2020   LDLCALC 89 02/26/2020   TRIG 41 02/26/2020   Lab Results  Component Value Date   WBC 7.5 02/26/2020   HGB 14.4 02/26/2020   HCT 41.7 02/26/2020   MCV  88 02/26/2020   PLT 214 02/26/2020   Lab Results  Component Value Date   IRON 87 02/26/2020   TIBC 319 02/26/2020   FERRITIN 71 02/26/2020   Attestation Statements:   Reviewed by clinician on day of visit: allergies, medications, problem list, medical history, surgical history, family history, social history, and previous encounter notes.  Time spent on visit including pre-visit chart review and post-visit care and charting was 15 minutes.   I, Water quality scientist, CMA, am acting as transcriptionist for Coralie Common, MD.  I have  reviewed the above documentation for accuracy and completeness, and I agree with the above. - Ilene Qua, MD

## 2020-04-01 NOTE — Progress Notes (Deleted)
Virtual Visit via Video Note The purpose of this virtual visit is to provide medical care while limiting exposure to the novel coronavirus.    Consent was obtained for video visit:  Yes.   Answered questions that patient had about telehealth interaction:  Yes.   I discussed the limitations, risks, security and privacy concerns of performing an evaluation and management service by telemedicine. I also discussed with the patient that there may be a patient responsible charge related to this service. The patient expressed understanding and agreed to proceed.  Pt location: Home Physician Location: office Name of referring provider:  Denzil Magnuson, NP I connected with Kathreen Cornfield at patients initiation/request on 04/02/2020 at 10:30 AM EDT by video enabled telemedicine application and verified that I am speaking with the correct person using two identifiers. Pt MRN:  [id number] Pt DOB:  May 12, 1991 Video Participants:  Kendra Baird   History of Present Illness:  Kendra Priceis a 64 year oldright-handed female who follows up for migraine.  UPDATE: She is no longer breastfeeding.  Current NSAIDS:ibuprofen Current analgesics:Tylenol Current triptans:None Current ergotamine:None Current anti-emetic:None Current muscle relaxants:None Current anti-anxiolytic:None Current sleep aide:None Current Antihypertensive medications:None Current Antidepressant medications:None Current Anticonvulsant medications:None Current anti-CGRP:None Current Vitamins/Herbal/Supplements:magnesium 400mg  daily; prenatal Current Antihistamines/Decongestants:None Other therapy:None  Caffeine:Rarely drinks coffee Diet:Drinks 1 gallon of water daily. Drinks little soda. Exercise:Runs, light free weights Depression:no; Anxiety:no Other pain:no Sleep hygiene:good  HISTORY: Onset:She has had migraines since highschool. She reports a few concussions in  highschool basketball. Severe in college and then improved.  Location:Right peri-orbital and then radiates over the right side of her head to the shoulder. Quality:Pressure/non-throbbing Initial intensity:4-5/10 (8.5/10 debilitating).Shedenies new headache, thunderclap headache Aura:No Prodrome:No Postdrome:No Associated symptoms:Loss of appetite, sometimes nausea, photophobia or phonophobia.Shedenies associated unilateral numbness or weakness. Initial Duration:All day Initial Frequency:Usually every other month (debilitating one every 4 months) Initial Frequency of abortive medication:every other month Triggers:Certain prenatal vitamins Relieving factors:Sleep, caffeine, hydration Activity:No  Headaches improved during her first pregnancy in 2017.  They began to become more frequent about 1 1/2 years after she gave birth to her first son.   On 10/9/2019she had another typical debilitating migraine. However, she had passed out, which was different. She went to the EDfor evaluation and treatment of her headache. She received a headache cocktail of Reglan and Benadryl. CT of the head without contrast was personally reviewed and was unremarkable. She hasn't had a migraine since then.   Past NSAIDS:Ibuprofen, naproxen Past analgesics:Tylenol, Excedrin Past abortive triptans:Maybe sumatriptan Past abortive ergotamine:None Past muscle relaxants:None Past anti-emetic:None Past antihypertensive medications:None Past antidepressant medications:None Past anticonvulsant medications:None Past anti-CGRP:None Past vitamins/Herbal/Supplements:None Past antihistamines/decongestants:None Other past therapies:None  Family history of headache:no  Past Medical History: Past Medical History:  Diagnosis Date  . Anxiety   . Back pain   . Depression   . Fatigue   . Headache   . Hx of varicella   . Joint pain   . Kidney  problem   . Migraine     Medications: Outpatient Encounter Medications as of 04/02/2020  Medication Sig  . Collagen-Vitamin C (COLLAGEN PLUS VITAMIN C PO) Take 1 tablet by mouth 2 (two) times daily.  . INCASSIA 0.35 MG tablet TAKE 1 TABLET BY MOUTH EVERY DAY **TAKE CONTINUOUSLY  . Magnesium 400 MG TABS Take 1 tablet by mouth daily.  . Prenatal Vit-Fe Fumarate-FA (PRENATAL MULTIVITAMIN) TABS tablet Take 1 tablet by mouth daily at 12 noon.   No facility-administered encounter medications on file as of 04/02/2020.  Allergies: No Known Allergies  Family History: Family History  Problem Relation Age of Onset  . Varicose Veins Mother   . Cancer Mother   . Obesity Mother   . Diabetes Father   . Cancer Father 72  . Hypertension Father   . Heart attack Father   . CVA Father   . Renal Disease Father   . Obesity Father   . Varicose Veins Brother   . Heart disease Paternal Grandfather     Social History: Social History   Socioeconomic History  . Marital status: Married    Spouse name: Kendra Baird  . Number of children: 2  . Years of education: Not on file  . Highest education level: Bachelor's degree (e.g., BA, AB, BS)  Occupational History  . Occupation: Reservations    CommentLexicographer  Tobacco Use  . Smoking status: Never Smoker  . Smokeless tobacco: Never Used  Substance and Sexual Activity  . Alcohol use: No  . Drug use: No  . Sexual activity: Not Currently  Other Topics Concern  . Not on file  Social History Narrative   Patient is right-handed. She lives with her husband and son in a one level home, with a few steps to enter from garage. She rarely drinks caffeine - maybe 1 cup coffee a day. She does not exercise.      Baby born Aug 08, 2021   Social Determinants of Health   Financial Resource Strain: Low Risk   . Difficulty of Paying Living Expenses: Not hard at all  Food Insecurity: No Food Insecurity  . Worried About Programme researcher, broadcasting/film/video in the Last  Year: Never true  . Ran Out of Food in the Last Year: Never true  Transportation Needs: Unknown  . Lack of Transportation (Medical): No  . Lack of Transportation (Non-Medical): Not on file  Physical Activity:   . Days of Exercise per Week:   . Minutes of Exercise per Session:   Stress: Stress Concern Present  . Feeling of Stress : To some extent  Social Connections:   . Frequency of Communication with Friends and Family:   . Frequency of Social Gatherings with Friends and Family:   . Attends Religious Services:   . Active Member of Clubs or Organizations:   . Attends Banker Meetings:   Marland Kitchen Marital Status:   Intimate Partner Violence: Not At Risk  . Fear of Current or Ex-Partner: No  . Emotionally Abused: No  . Physically Abused: No  . Sexually Abused: No    Observations/Objective:   *** No acute distress.  Alert and oriented.  Speech fluent and not dysarthric.  Language intact.  Eyes orthophoric on primary gaze.  Face symmetric.  Assessment and Plan:   Migraine without aura, without status migrainosus, not intractable.  ***  Follow Up Instructions:    -I discussed the assessment and treatment plan with the patient. The patient was provided an opportunity to ask questions and all were answered. The patient agreed with the plan and demonstrated an understanding of the instructions.   The patient was advised to call back or seek an in-person evaluation if the symptoms worsen or if the condition fails to improve as anticipated.   Cira Servant, DO

## 2020-04-02 ENCOUNTER — Telehealth: Payer: Self-pay | Admitting: Neurology

## 2020-04-02 ENCOUNTER — Other Ambulatory Visit: Payer: Self-pay

## 2020-04-09 ENCOUNTER — Encounter (INDEPENDENT_AMBULATORY_CARE_PROVIDER_SITE_OTHER): Payer: Self-pay | Admitting: Family Medicine

## 2020-04-09 ENCOUNTER — Other Ambulatory Visit: Payer: Self-pay

## 2020-04-09 ENCOUNTER — Ambulatory Visit (INDEPENDENT_AMBULATORY_CARE_PROVIDER_SITE_OTHER): Payer: Commercial Managed Care - PPO | Admitting: Family Medicine

## 2020-04-09 VITALS — BP 95/66 | HR 66 | Temp 98.0°F | Ht 66.0 in | Wt 213.0 lb

## 2020-04-09 DIAGNOSIS — F3289 Other specified depressive episodes: Secondary | ICD-10-CM | POA: Diagnosis not present

## 2020-04-09 DIAGNOSIS — Z6834 Body mass index (BMI) 34.0-34.9, adult: Secondary | ICD-10-CM | POA: Diagnosis not present

## 2020-04-09 DIAGNOSIS — E669 Obesity, unspecified: Secondary | ICD-10-CM

## 2020-04-09 DIAGNOSIS — R638 Other symptoms and signs concerning food and fluid intake: Secondary | ICD-10-CM | POA: Diagnosis not present

## 2020-04-09 NOTE — Progress Notes (Addendum)
Chief Complaint:   OBESITY Kendra Baird is here to discuss her progress with her obesity treatment plan along with follow-up of her obesity related diagnoses. Kendra Baird is on the Category 3 Plan and states she is following her eating plan approximately 98% of the time. Kendra Baird states she is exercising for 0 minutes 0 times per week.  Today's visit was #: 4 Starting weight: 222 lbs Starting date: 02/26/2020 Today's weight: 213 lbs Today's date: 04/09/2020 Total lbs lost to date: 9 lbs Total lbs lost since last in-office visit: 5 lbs  Interim History: Kendra Baird reports that she has been getting around 7000 steps per day.  Subjective:   1. Breastfeeding (infant) Kendra Baird says she continues to breastfeed and that her supply is meeting her 72 month old's demands. She has decreased night-time pumping.  2. Other depression, with emotional eating Kendra Baird is struggling with emotional eating and using food for comfort to the extent that it is negatively impacting her health. She has been working on behavior modification techniques to help reduce her emotional eating and has been successful.   Assessment/Plan:   1. Increased nutritional needs We reviewed the importance of adequate calories to maintain breasttmilk production. She is having no concerns at this time. She also has a large frozen supply and feels that she would be okay she needed to use it.  2. Other depression, with emotional eating Behavior modification techniques were discussed today to help Kendra Baird deal with her emotional/non-hunger eating behaviors.  Orders and follow up as documented in patient record.   3. Class 1 obesity with serious comorbidity and body mass index (BMI) of 34.0 to 34.9 in adult, unspecified obesity type Kendra Baird is currently in the action stage of change. As such, her goal is to continue with weight loss efforts. She has agreed to the Category 3 Plan.   Exercise goals: No exercise has been prescribed at this  time.  Behavioral modification strategies: increasing lean protein intake and increasing water intake.  Kendra Baird has agreed to follow-up with our clinic in 2 weeks. She was informed of the importance of frequent follow-up visits to maximize her success with intensive lifestyle modifications for her multiple health conditions.   Objective:   Blood pressure 95/66, pulse 66, temperature 98 F (36.7 C), temperature source Oral, height 5\' 6"  (1.676 m), weight 213 lb (96.6 kg), SpO2 97 %, currently breastfeeding. Body mass index is 34.38 kg/m.  General: Cooperative, alert, well developed, in no acute distress. HEENT: Conjunctivae and lids unremarkable. Cardiovascular: Regular rhythm.  Lungs: Normal work of breathing. Neurologic: No focal deficits.   Lab Results  Component Value Date   CREATININE 0.80 02/26/2020   BUN 13 02/26/2020   NA 141 02/26/2020   K 4.3 02/26/2020   CL 102 02/26/2020   CO2 19 (L) 02/26/2020   Lab Results  Component Value Date   ALT 11 02/26/2020   AST 15 02/26/2020   ALKPHOS 76 02/26/2020   BILITOT 0.7 02/26/2020   Lab Results  Component Value Date   HGBA1C 5.1 02/26/2020   Lab Results  Component Value Date   INSULIN 2.5 (L) 02/26/2020   Lab Results  Component Value Date   TSH 0.535 02/26/2020   Lab Results  Component Value Date   CHOL 166 02/26/2020   HDL 68 02/26/2020   LDLCALC 89 02/26/2020   TRIG 41 02/26/2020   Lab Results  Component Value Date   WBC 7.5 02/26/2020   HGB 14.4 02/26/2020   HCT 41.7  02/26/2020   MCV 88 02/26/2020   PLT 214 02/26/2020   Lab Results  Component Value Date   IRON 87 02/26/2020   TIBC 319 02/26/2020   FERRITIN 71 02/26/2020   Attestation Statements:   Reviewed by clinician on day of visit: allergies, medications, problem list, medical history, surgical history, family history, social history, and previous encounter notes.  I, Water quality scientist, CMA, am acting as Location manager for [employer], DO.  I  have reviewed the above documentation for accuracy and completeness, and I agree with the above. Briscoe Deutscher, DO   Time spent on visit including pre-visit chart review and post-visit care and documentation was 25 minutes.

## 2020-05-05 ENCOUNTER — Other Ambulatory Visit: Payer: Self-pay

## 2020-05-05 ENCOUNTER — Ambulatory Visit (INDEPENDENT_AMBULATORY_CARE_PROVIDER_SITE_OTHER): Payer: Commercial Managed Care - PPO | Admitting: Family Medicine

## 2020-05-05 ENCOUNTER — Encounter (INDEPENDENT_AMBULATORY_CARE_PROVIDER_SITE_OTHER): Payer: Self-pay | Admitting: Family Medicine

## 2020-05-05 VITALS — BP 106/79 | HR 69 | Temp 97.9°F | Ht 66.0 in | Wt 216.0 lb

## 2020-05-05 DIAGNOSIS — F3289 Other specified depressive episodes: Secondary | ICD-10-CM

## 2020-05-05 DIAGNOSIS — Z6835 Body mass index (BMI) 35.0-35.9, adult: Secondary | ICD-10-CM

## 2020-05-05 DIAGNOSIS — Z9189 Other specified personal risk factors, not elsewhere classified: Secondary | ICD-10-CM

## 2020-05-05 DIAGNOSIS — R638 Other symptoms and signs concerning food and fluid intake: Secondary | ICD-10-CM | POA: Diagnosis not present

## 2020-05-05 DIAGNOSIS — E559 Vitamin D deficiency, unspecified: Secondary | ICD-10-CM | POA: Diagnosis not present

## 2020-05-05 NOTE — Progress Notes (Signed)
Chief Complaint:   OBESITY Kendra Baird is here to discuss her progress with her obesity treatment plan along with follow-up of her obesity related diagnoses. Kendra Baird is on the Category 3 Plan and states she is following her eating plan approximately 80% of the time. Kendra Baird states she is walking and running intervals for 30 minutes 3 times per week.  Today's visit was #: 5 Starting weight: 222 lbs Starting date: 02/26/2020 Today's weight: 216 lbs Today's date: 05/05/2020 Total lbs lost to date: 6 lbs Total lbs lost since last in-office visit: 0  Interim History: Kendra Baird says that she has been stress eating.  She is unhappy with her current job and signed with another. She will be giving her 2 week notice today.  Subjective:   1. Vitamin D deficiency Kendra Baird's Vitamin D level was 30.8 on 02/26/2020. She is currently taking a multivitamin. She denies nausea, vomiting or muscle weakness.  2. Increased nutritional needs Due to breastfeeding.  3. Other depression, with emotional eating Kendra Baird is struggling with emotional eating and using food for comfort to the extent that it is negatively impacting her health. She has been working on behavior modification techniques to help reduce her emotional eating and has been successful. She shows no sign of suicidal or homicidal ideations.  Assessment/Plan:   1. Vitamin D deficiency Low Vitamin D level contributes to fatigue and are associated with obesity, breast, and colon cancer. She agrees to continue to take prescription Vitamin D @50 ,000 IU every week and will follow-up for routine testing of Vitamin D, at least 2-3 times per year to avoid over-replacement.  2. Increased nutritional needs Will continue to monitor.   3. Other depression, with emotional eating Behavior modification techniques were discussed today to help Kendra Baird deal with her emotional/non-hunger eating behaviors.  Orders and follow up as documented in patient record.   4. At risk  for anxiety Kendra Baird was given approximately 15 minutes of anxiety risk counseling today. She has risk factors for anxiety including - transition to new job. We discussed the importance of a healthy work life balance, a healthy relationship with food and a good support system.  Repetitive spaced learning was employed today to elicit superior memory formation and behavioral change.  5. Class 2 severe obesity with serious comorbidity and body mass index (BMI) of 35.0 to 35.9 in adult, unspecified obesity type (HCC) Kendra Baird is currently in the action stage of change. As such, her goal is to continue with weight loss efforts. She has agreed to the Category 3 Plan.   Exercise goals: For substantial health benefits, adults should do at least 150 minutes (2 hours and 30 minutes) a week of moderate-intensity, or 75 minutes (1 hour and 15 minutes) a week of vigorous-intensity aerobic physical activity, or an equivalent combination of moderate- and vigorous-intensity aerobic activity. Aerobic activity should be performed in episodes of at least 10 minutes, and preferably, it should be spread throughout the week.  Behavioral modification strategies: increasing lean protein intake and increasing water intake.  Kendra Baird has agreed to follow-up with our clinic in 2 weeks. She was informed of the importance of frequent follow-up visits to maximize her success with intensive lifestyle modifications for her multiple health conditions.   Objective:   Blood pressure 106/79, pulse 69, temperature 97.9 F (36.6 C), temperature source Oral, height 5\' 6"  (1.676 m), weight 216 lb (98 kg), last menstrual period 10/28/2018, SpO2 99 %, currently breastfeeding. Body mass index is 34.86 kg/m.  General: Cooperative,  alert, well developed, in no acute distress. HEENT: Conjunctivae and lids unremarkable. Cardiovascular: Regular rhythm.  Lungs: Normal work of breathing. Neurologic: No focal deficits.   Lab Results  Component  Value Date   CREATININE 0.80 02/26/2020   BUN 13 02/26/2020   NA 141 02/26/2020   K 4.3 02/26/2020   CL 102 02/26/2020   CO2 19 (L) 02/26/2020   Lab Results  Component Value Date   ALT 11 02/26/2020   AST 15 02/26/2020   ALKPHOS 76 02/26/2020   BILITOT 0.7 02/26/2020   Lab Results  Component Value Date   HGBA1C 5.1 02/26/2020   Lab Results  Component Value Date   INSULIN 2.5 (L) 02/26/2020   Lab Results  Component Value Date   TSH 0.535 02/26/2020   Lab Results  Component Value Date   CHOL 166 02/26/2020   HDL 68 02/26/2020   LDLCALC 89 02/26/2020   TRIG 41 02/26/2020   Lab Results  Component Value Date   WBC 7.5 02/26/2020   HGB 14.4 02/26/2020   HCT 41.7 02/26/2020   MCV 88 02/26/2020   PLT 214 02/26/2020   Lab Results  Component Value Date   IRON 87 02/26/2020   TIBC 319 02/26/2020   FERRITIN 71 02/26/2020   Attestation Statements:   Reviewed by clinician on day of visit: allergies, medications, problem list, medical history, surgical history, family history, social history, and previous encounter notes.  I, Water quality scientist, CMA, am acting as Location manager for [employer], DO.  I have reviewed the above documentation for accuracy and completeness, and I agree with the above. Briscoe Deutscher, DO

## 2020-05-25 ENCOUNTER — Encounter (INDEPENDENT_AMBULATORY_CARE_PROVIDER_SITE_OTHER): Payer: Self-pay | Admitting: Family Medicine

## 2020-05-27 NOTE — Telephone Encounter (Signed)
Please r/s

## 2020-05-28 ENCOUNTER — Ambulatory Visit (INDEPENDENT_AMBULATORY_CARE_PROVIDER_SITE_OTHER): Payer: Commercial Managed Care - PPO | Admitting: Family Medicine

## 2020-05-29 NOTE — Progress Notes (Signed)
Virtual Visit via Video Note The purpose of this virtual visit is to provide medical care while limiting exposure to the novel coronavirus.    Consent was obtained for video visit:  Yes.   Answered questions that patient had about telehealth interaction:  Yes.   I discussed the limitations, risks, security and privacy concerns of performing an evaluation and management service by telemedicine. I also discussed with the patient that there may be a patient responsible charge related to this service. The patient expressed understanding and agreed to proceed.  Pt location: Home Physician Location: office Name of referring provider:  Kennon Rounds, NP I connected with Kendra Baird at patients initiation/request on 05/30/2020 at  8:30 AM EDT by video enabled telemedicine application and verified that I am speaking with the correct person using two identifiers. Pt MRN:  [id number] Pt DOB:  1991-05-29 Video Participants:  Kendra Baird   History of Present Illness:  Kendra Baird a 3 year oldright-handed female who follows up for migraine.  UPDATE: Medical management was put on-hold in December after she found out that she was pregnant.  She is starting to wean off of breastfeeding.  Migraines have been okay overall.  She has a new job which will hopefully reduce stress.  In May, she had 2 migraines, moderate to severe lasting several hours.  She treated with Tylenol.  Current NSAIDS:ibuprofen Current analgesics:Tylenol Current triptans:None Current ergotamine:None Current anti-emetic:None Current muscle relaxants:None Current anti-anxiolytic:None Current sleep aide:None Current Antihypertensive medications:None Current Antidepressant medications:None Current Anticonvulsant medications:None Current anti-CGRP:None Current Vitamins/Herbal/Supplements:magnesium gluconate 500mg  twice daily Current Antihistamines/Decongestants:None Other  therapy:None  Caffeine:Rarely drinks coffee Diet:Drinks 1 gallon of water daily. Drinks little soda. Exercise:Runs, light free weights Depression:no; Anxiety:no Other pain:no Sleep hygiene:good  HISTORY: Onset:She has had migraines since highschool. She reports a few concussions in highschool basketball. Severe in college and then improved.  Location:Right peri-orbital and then radiates over the right side of her head to the shoulder. Quality:Pressure/non-throbbing Initial intensity:4-5/10 (8.5/10 debilitating).Shedenies new headache, thunderclap headache Aura:No Prodrome:No Postdrome:No Associated symptoms:Loss of appetite, sometimes nausea, photophobia or phonophobia.Shedenies associated unilateral numbness or weakness. Initial Duration:All day Initial Frequency:Usually every other month (debilitating one every 4 months) Initial Frequency of abortive medication:every other month Triggers:Certain prenatal vitamins Relieving factors:Sleep, caffeine, hydration Activity:No  Headaches improved during her first pregnancy in 2017.  They began to become more frequent about 1 1/2 years after she gave birth to her first son.   On 10/9/2019she had another typical debilitating migraine. However, she had passed out, which was different. She went to the EDfor evaluation and treatment of her headache. She received a headache cocktail of Reglan and Benadryl. CT of the head without contrast was personally reviewed and was unremarkable. She hasn't had a migraine since then.   Past NSAIDS:Ibuprofen, naproxen Past analgesics:Tylenol, Excedrin Past abortive triptans:Maybe sumatriptan Past abortive ergotamine:None Past muscle relaxants:None Past anti-emetic:None Past antihypertensive medications:None Past antidepressant medications:None Past anticonvulsant medications:None Past anti-CGRP:None Past  vitamins/Herbal/Supplements:None Past antihistamines/decongestants:None Other past therapies:None  Family history of headache:no  Past Medical History: Past Medical History:  Diagnosis Date  . Anxiety   . Back pain   . Depression   . Fatigue   . Headache   . Hx of varicella   . Joint pain   . Kidney problem   . Migraine     Medications: Outpatient Encounter Medications as of 05/30/2020  Medication Sig  . Collagen-Vitamin C (COLLAGEN PLUS VITAMIN C PO) Take 1 tablet by mouth 2 (two) times daily.  07/30/2020  INCASSIA 0.35 MG tablet TAKE 1 TABLET BY MOUTH EVERY DAY **TAKE CONTINUOUSLY  . Magnesium 400 MG TABS Take 1 tablet by mouth daily.  . Prenatal Vit-Fe Fumarate-FA (PRENATAL MULTIVITAMIN) TABS tablet Take 1 tablet by mouth daily at 12 noon.   No facility-administered encounter medications on file as of 05/30/2020.    Allergies: No Known Allergies  Family History: Family History  Problem Relation Age of Onset  . Varicose Veins Mother   . Cancer Mother   . Obesity Mother   . Diabetes Father   . Cancer Father 63  . Hypertension Father   . Heart attack Father   . CVA Father   . Renal Disease Father   . Obesity Father   . Varicose Veins Brother   . Heart disease Paternal Grandfather     Social History: Social History   Socioeconomic History  . Marital status: Married    Spouse name: Timesha Cervantez  . Number of children: 2  . Years of education: Not on file  . Highest education level: Bachelor's degree (e.g., BA, AB, BS)  Occupational History  . 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 Smoking status: Never Smoker  . Smokeless tobacco: Never Used  Substance and Sexual Activity  . Alcohol use: No  . Drug use: No  . Sexual activity: Not Currently  Other Topics Concern  . Not on file  Social History Narrative   Patient is right-handed. She lives with her husband and son in a one level home, with a few steps to enter from garage.  She rarely drinks caffeine - maybe 1 cup coffee a day. She does not exercise.      Baby born Aug 08, 2021   Social Determinants of Health   Financial Resource Strain: Low Risk   . Difficulty of Paying Living Expenses: Not hard at all  Food Insecurity: No Food Insecurity  . Worried About Charity fundraiser in the Last Year: Never true  . Ran Out of Food in the Last Year: Never true  Transportation Needs: Unknown  . Lack of Transportation (Medical): No  . Lack of Transportation (Non-Medical): Not on file  Physical Activity:   . Days of Exercise per Week:   . Minutes of Exercise per Session:   Stress: Stress Concern Present  . Feeling of Stress : To some extent  Social Connections:   . Frequency of Communication with Friends and Family:   . Frequency of Social Gatherings with Friends and Family:   . Attends Religious Services:   . Active Member of Clubs or Organizations:   . Attends Archivist Meetings:   Marland Kitchen Marital Status:   Intimate Partner Violence: Not At Risk  . Fear of Current or Ex-Partner: No  . Emotionally Abused: No  . Physically Abused: No  . Sexually Abused: No    Observations/Objective:   currently breastfeeding. No acute distress.  Alert and oriented.  Speech fluent and not dysarthric.  Language intact.  Eyes orthophoric on primary gaze.  Face symmetric.  Assessment and Plan:   Migraine without aura, without status migrainosus, not intractable  1.  For preventative management, defer pharmacologic management. 2.  For abortive therapy,sumatriptan 100mg  in attempt to break migraine more quickly.  Advised to hold breastfeeding for at least 8 to 12 hours after use. 3.  Limit use of pain relievers to no more than 2 days out of week to prevent risk of rebound or medication-overuse headache.  4.  Keep headache diary 5.  Exercise, hydration, caffeine cessation, sleep hygiene, monitor for and avoid triggers 6.  Follow up 6 months.   Follow Up Instructions:      -I discussed the assessment and treatment plan with the patient. The patient was provided an opportunity to ask questions and all were answered. The patient agreed with the plan and demonstrated an understanding of the instructions.   The patient was advised to call back or seek an in-person evaluation if the symptoms worsen or if the condition fails to improve as anticipated.   Cira Servant, DO

## 2020-05-30 ENCOUNTER — Other Ambulatory Visit: Payer: Self-pay

## 2020-05-30 ENCOUNTER — Encounter: Payer: Self-pay | Admitting: Neurology

## 2020-05-30 ENCOUNTER — Telehealth (INDEPENDENT_AMBULATORY_CARE_PROVIDER_SITE_OTHER): Payer: BC Managed Care – PPO | Admitting: Neurology

## 2020-05-30 DIAGNOSIS — G43009 Migraine without aura, not intractable, without status migrainosus: Secondary | ICD-10-CM

## 2020-05-30 MED ORDER — SUMATRIPTAN SUCCINATE 100 MG PO TABS: ORAL_TABLET | ORAL | 5 refills | Status: DC

## 2020-06-10 ENCOUNTER — Encounter (INDEPENDENT_AMBULATORY_CARE_PROVIDER_SITE_OTHER): Payer: Self-pay | Admitting: Adult Health

## 2020-06-10 ENCOUNTER — Ambulatory Visit (INDEPENDENT_AMBULATORY_CARE_PROVIDER_SITE_OTHER): Payer: BC Managed Care – PPO | Admitting: Adult Health

## 2020-06-10 ENCOUNTER — Other Ambulatory Visit: Payer: Self-pay

## 2020-06-10 VITALS — BP 93/65 | HR 69 | Temp 97.9°F | Ht 66.0 in | Wt 213.0 lb

## 2020-06-10 DIAGNOSIS — Z6834 Body mass index (BMI) 34.0-34.9, adult: Secondary | ICD-10-CM

## 2020-06-10 DIAGNOSIS — R7989 Other specified abnormal findings of blood chemistry: Secondary | ICD-10-CM

## 2020-06-10 DIAGNOSIS — E559 Vitamin D deficiency, unspecified: Secondary | ICD-10-CM | POA: Diagnosis not present

## 2020-06-10 DIAGNOSIS — E669 Obesity, unspecified: Secondary | ICD-10-CM

## 2020-06-10 DIAGNOSIS — Z6835 Body mass index (BMI) 35.0-35.9, adult: Secondary | ICD-10-CM | POA: Insufficient documentation

## 2020-06-10 DIAGNOSIS — Z9189 Other specified personal risk factors, not elsewhere classified: Secondary | ICD-10-CM | POA: Insufficient documentation

## 2020-06-10 NOTE — Progress Notes (Signed)
Chief Complaint:   OBESITY Kendra Baird is here to discuss her progress with her obesity treatment plan along with follow-up of her obesity related diagnoses. Kendra Baird is on the Category 3 Plan and states she is following her eating plan approximately 80% of the time. Kendra Baird states she is doing cardio and ab exercises for 15-30 minutes 3-5 times per week.  Today's visit was #: 6 Starting weight: 222 lbs Starting date: 02/26/2020 Today's weight: 213 lbs Today's date: 06/10/2020 Total lbs lost to date: 9 Total lbs lost since last in-office visit: 3  Interim History: Kendra Baird will generally follow the Category 3 meal plan with occasional use of her calorie goal to remain on track. She continues to drink >gallon of water per day. She reports significant decreased stress since changing jobs. She is still breast feeding at least twice per day, however she is not pumping anymore.  Subjective:   1. Vitamin D deficiency Kendra Baird's Vit D level on 02/26/2020 was 30.8. She is taking prenatal plus OTC Vit D3 supplementations. She is unsure of the daily dosage.  2. Abnormal thyroid blood test Kendra Baird's T3 on 02/26/2020 was 236, TSH and free T4 were normal. She is not on levothyroxine. Was ecommended to recheck labs in 4-6 weeks, has yet to be checked.. She reports less fatigue since changing jobs.  3. At risk for osteoporosis Kendra Baird is at higher risk of osteopenia and osteoporosis due to Vitamin D deficiency.   Assessment/Plan:   1. Vitamin D deficiency Low Vitamin D level contributes to fatigue and are associated with obesity, breast, and colon cancer. We will check labs today, and Kendra Baird will continue taking her current supplementations, not on Rx strength- still breastfeeding. She will follow-up for routine testing of Vitamin D, at least 2-3 times per year to avoid over-replacement.  - VITAMIN D 25 Hydroxy (Vit-D Deficiency, Fractures)  2. Abnormal thyroid blood test We will check labs today. Orders and  follow up as documented in patient record.  Counseling . Good thyroid control is important for overall health. Supratherapeutic thyroid levels are dangerous and will not improve weight loss results.  - T4, free - T3, free - TSH  3. At risk for osteoporosis Kendra Baird was given approximately 15 minutes of osteoporosis prevention counseling today. Kendra Baird is at risk for osteopenia and osteoporosis due to her Vitamin D deficiency. She was encouraged to take her Vitamin D and follow her higher calcium diet and increase strengthening exercise to help strengthen her bones and decrease her risk of osteopenia and osteoporosis.  Repetitive spaced learning was employed today to elicit superior memory formation and behavioral change.  4. Class 1 obesity with serious comorbidity and body mass index (BMI) of 34.0 to 34.9 in adult, unspecified obesity type Kendra Baird is currently in the action stage of change. As such, her goal is to continue with weight loss efforts. She has agreed to the Category 3 Plan.   Exercise goals: As is.  Behavioral modification strategies: increasing lean protein intake, no skipping meals and meal planning and cooking strategies.  Kendra Baird has agreed to follow-up with our clinic in 2 to 3 weeks. She was informed of the importance of frequent follow-up visits to maximize her success with intensive lifestyle modifications for her multiple health conditions.   Kendra Baird was informed we would discuss her lab results at her next visit unless there is a critical issue that needs to be addressed sooner. Kendra Baird agreed to keep her next visit at the agreed upon time to  discuss these results.  Objective:   Blood pressure 93/65, pulse 69, temperature 97.9 F (36.6 C), temperature source Oral, height 5\' 6"  (1.676 m), weight 213 lb (96.6 kg), last menstrual period 10/27/2018, SpO2 98 %, currently breastfeeding. Body mass index is 34.38 kg/m.  General: Cooperative, alert, well developed, in no acute  distress. HEENT: Conjunctivae and lids unremarkable. Cardiovascular: Regular rhythm.  Lungs: Normal work of breathing. Neurologic: No focal deficits.   Lab Results  Component Value Date   CREATININE 0.80 02/26/2020   BUN 13 02/26/2020   NA 141 02/26/2020   K 4.3 02/26/2020   CL 102 02/26/2020   CO2 19 (L) 02/26/2020   Lab Results  Component Value Date   ALT 11 02/26/2020   AST 15 02/26/2020   ALKPHOS 76 02/26/2020   BILITOT 0.7 02/26/2020   Lab Results  Component Value Date   HGBA1C 5.1 02/26/2020   Lab Results  Component Value Date   INSULIN 2.5 (L) 02/26/2020   Lab Results  Component Value Date   TSH 0.535 02/26/2020   Lab Results  Component Value Date   CHOL 166 02/26/2020   HDL 68 02/26/2020   LDLCALC 89 02/26/2020   TRIG 41 02/26/2020   Lab Results  Component Value Date   WBC 7.5 02/26/2020   HGB 14.4 02/26/2020   HCT 41.7 02/26/2020   MCV 88 02/26/2020   PLT 214 02/26/2020   Lab Results  Component Value Date   IRON 87 02/26/2020   TIBC 319 02/26/2020   FERRITIN 71 02/26/2020   Attestation Statements:   Reviewed by clinician on day of visit: allergies, medications, problem list, medical history, surgical history, family history, social history, and previous encounter notes.   Wilhemena Durie, am acting as Location manager for [employer], NP-C.  I have reviewed the above documentation for accuracy and completeness, and I agree with the above. -  Esaw Grandchild, NP

## 2020-06-11 LAB — VITAMIN D 25 HYDROXY (VIT D DEFICIENCY, FRACTURES): Vit D, 25-Hydroxy: 33.5 ng/mL (ref 30.0–100.0)

## 2020-06-11 LAB — TSH: TSH: 0.56 u[IU]/mL (ref 0.450–4.500)

## 2020-06-11 LAB — T4, FREE: Free T4: 1.53 ng/dL (ref 0.82–1.77)

## 2020-06-11 LAB — T3, FREE: T3, Free: 4.2 pg/mL (ref 2.0–4.4)

## 2020-06-24 ENCOUNTER — Other Ambulatory Visit: Payer: Self-pay

## 2020-06-24 ENCOUNTER — Encounter (INDEPENDENT_AMBULATORY_CARE_PROVIDER_SITE_OTHER): Payer: Self-pay | Admitting: Adult Health

## 2020-06-24 ENCOUNTER — Ambulatory Visit (INDEPENDENT_AMBULATORY_CARE_PROVIDER_SITE_OTHER): Payer: BC Managed Care – PPO | Admitting: Adult Health

## 2020-06-24 VITALS — BP 118/79 | HR 90 | Temp 98.0°F | Ht 66.0 in | Wt 216.0 lb

## 2020-06-24 DIAGNOSIS — Z6834 Body mass index (BMI) 34.0-34.9, adult: Secondary | ICD-10-CM

## 2020-06-24 DIAGNOSIS — E669 Obesity, unspecified: Secondary | ICD-10-CM | POA: Diagnosis not present

## 2020-06-24 DIAGNOSIS — E559 Vitamin D deficiency, unspecified: Secondary | ICD-10-CM | POA: Diagnosis not present

## 2020-06-24 DIAGNOSIS — R7989 Other specified abnormal findings of blood chemistry: Secondary | ICD-10-CM

## 2020-06-24 NOTE — Progress Notes (Signed)
Chief Complaint:   OBESITY Kendra Baird is here to discuss her progress with her obesity treatment plan along with follow-up of her obesity related diagnoses. Kendra Baird is on the Category 3 Plan and states she is following her eating plan approximately 90% of the time. Kendra Baird states she is walking and running 30 minutes 2 times per week.  Today's visit was #: 7 Starting weight: 222 lbs Starting date: 02/26/2020 Today's weight: 216 lbs Today's date: 06/24/2020 Total lbs lost to date: 6 Total lbs lost since last in-office visit: 0  Interim History: Kendra Baird recently started weaning breastfeeding - currently only a 5-10 minute feed each morning. Her second son is 48 months old. She feels that her son and she are both ready to cease breastfeeding. She will occasionally struggle to consume all carbohydrate foods on the plan.  Subjective:   Vitamin D deficiency. Kendra Baird is taking an OTC D3 supplement plus an OTC prenatal vitamin.   Ref. Range 06/10/2020 11:09  Vitamin D, 25-Hydroxy Latest Ref Range: 30.0 - 100.0 ng/mL 33.5   Abnormal thyroid blood test. Kendra Baird denies excessive fatigue. Recent labs were within normal limits.  Assessment/Plan:   Vitamin D deficiency. Low Vitamin D level contributes to fatigue and are associated with obesity, breast, and colon cancer. She will follow-up for routine testing of Vitamin D at her next office visit.  Abnormal thyroid blood test. Will monitor labs annually.  Class 1 obesity with serious comorbidity and body mass index (BMI) of 34.0 to 34.9 in adult, unspecified obesity type.  Kendra Baird is currently in the action stage of change. As such, her goal is to continue with weight loss efforts. She has agreed to the Category 3 Plan.   Exercise goals: Kendra Baird will continue her current exercise regimen.  Behavioral modification strategies: increasing lean protein intake, meal planning and cooking strategies and planning for success.  Kendra Baird has agreed to  follow-up with our clinic in 2 weeks. She was informed of the importance of frequent follow-up visits to maximize her success with intensive lifestyle modifications for her multiple health conditions.   Objective:   Blood pressure 118/79, pulse 90, temperature 98 F (36.7 C), temperature source Oral, height 5\' 6"  (1.676 m), weight 216 lb (98 kg), last menstrual period 11/15/2018, SpO2 99 %, currently breastfeeding. Body mass index is 34.86 kg/m.  General: Cooperative, alert, well developed, in no acute distress. HEENT: Conjunctivae and lids unremarkable. Cardiovascular: Regular rhythm.  Lungs: Normal work of breathing. Neurologic: No focal deficits.   Lab Results  Component Value Date   CREATININE 0.80 02/26/2020   BUN 13 02/26/2020   NA 141 02/26/2020   K 4.3 02/26/2020   CL 102 02/26/2020   CO2 19 (L) 02/26/2020   Lab Results  Component Value Date   ALT 11 02/26/2020   AST 15 02/26/2020   ALKPHOS 76 02/26/2020   BILITOT 0.7 02/26/2020   Lab Results  Component Value Date   HGBA1C 5.1 02/26/2020   Lab Results  Component Value Date   INSULIN 2.5 (L) 02/26/2020   Lab Results  Component Value Date   TSH 0.560 06/10/2020   Lab Results  Component Value Date   CHOL 166 02/26/2020   HDL 68 02/26/2020   LDLCALC 89 02/26/2020   TRIG 41 02/26/2020   Lab Results  Component Value Date   WBC 7.5 02/26/2020   HGB 14.4 02/26/2020   HCT 41.7 02/26/2020   MCV 88 02/26/2020   PLT 214 02/26/2020   Lab  Results  Component Value Date   IRON 87 02/26/2020   TIBC 319 02/26/2020   FERRITIN 71 02/26/2020   Attestation Statements:   Reviewed by clinician on day of visit: allergies, medications, problem list, medical history, surgical history, family history, social history, and previous encounter notes.  Time spent on visit including pre-visit chart review and post-visit charting and care was 25 minutes.   I, Marianna Payment, am acting as Energy manager for [employer], NP-C    I have reviewed the above documentation for accuracy and completeness, and I agree with the above. -  Julaine Fusi, NP

## 2020-07-09 ENCOUNTER — Other Ambulatory Visit: Payer: Self-pay

## 2020-07-09 ENCOUNTER — Encounter (INDEPENDENT_AMBULATORY_CARE_PROVIDER_SITE_OTHER): Payer: Self-pay | Admitting: Adult Health

## 2020-07-09 ENCOUNTER — Ambulatory Visit (INDEPENDENT_AMBULATORY_CARE_PROVIDER_SITE_OTHER): Payer: BC Managed Care – PPO | Admitting: Adult Health

## 2020-07-09 VITALS — BP 107/77 | HR 67 | Temp 97.9°F | Ht 66.0 in | Wt 217.0 lb

## 2020-07-09 DIAGNOSIS — G43909 Migraine, unspecified, not intractable, without status migrainosus: Secondary | ICD-10-CM | POA: Insufficient documentation

## 2020-07-09 DIAGNOSIS — E559 Vitamin D deficiency, unspecified: Secondary | ICD-10-CM | POA: Diagnosis not present

## 2020-07-09 DIAGNOSIS — Z6835 Body mass index (BMI) 35.0-35.9, adult: Secondary | ICD-10-CM | POA: Diagnosis not present

## 2020-07-09 DIAGNOSIS — Z9189 Other specified personal risk factors, not elsewhere classified: Secondary | ICD-10-CM

## 2020-07-09 DIAGNOSIS — G43809 Other migraine, not intractable, without status migrainosus: Secondary | ICD-10-CM | POA: Diagnosis not present

## 2020-07-09 NOTE — Progress Notes (Signed)
Chief Complaint:   OBESITY Kendra Baird is here to discuss her progress with her obesity treatment plan along with follow-up of her obesity related diagnoses. Kendra Baird is on the Category 3 Plan and states she is following her eating plan approximately 75-80% of the time. Kendra Baird states she is walking (traveling).   Today's visit was #: 8 Starting weight: 222 lbs Starting date: 02/26/2020 Today's weight: 217 lbs Today's date: 07/09/2020 Total lbs lost to date: 5 Total lbs lost since last in-office visit: 0  Interim History: Kendra Baird states over the last 2 weeks they traveled to [place] over [place] Day to watch [employer] play baseball and her mother also came to visit. She really tried to stay on track by focusing on protein and vegetables and portion control of carbohydrates. She did recently stop breastfeeding.  Subjective:   Vitamin D deficiency. Kendra Baird is on OTC prenatal vitamins. She recently stopped breastfeeding.   Ref. Range 06/10/2020 11:09  Vitamin D, 25-Hydroxy Latest Ref Range: 30.0 - 100.0 ng/mL 33.5   Other migraine without status migrainosus, not intractable. Kendra Baird reports symptoms are well controlled and she estimates to have 0-1 migraine a month. She estimates to drink at least 96 oz of water daily.  At risk for osteoporosis. Kendra Baird is at higher risk of osteopenia and osteoporosis due to Vitamin D deficiency and obesity.   Assessment/Plan:   Vitamin D deficiency. Low Vitamin D level contributes to fatigue and are associated with obesity, breast, and colon cancer. VITAMIN D 25 Hydroxy (Vit-D Deficiency, Fractures) level will be checked today.  Will send her MyChart message once labs result and whether Ergocalciferol is appropriate.  She has recently stopped breast feeding.  Other migraine without status migrainosus, not intractable. [name] will continue follow-up with Neurology as scheduled and as directed.  At risk for osteoporosis.  Kendra Baird was given approximately  15 minutes of osteoporosis prevention counseling today. Kendra Baird is at risk for osteopenia and osteoporosis due to her Vitamin D deficiency. She was encouraged to take her Vitamin D and follow her higher calcium diet and increase strengthening exercise to help strengthen her bones and decrease her risk of osteopenia and osteoporosis.  Repetitive spaced learning was employed today to elicit superior memory formation and behavioral change.  Class 2 severe obesity with serious comorbidity and body mass index (BMI) of 35.0 to 35.9 in adult, unspecified obesity type (HCC).  Kendra Baird is currently in the action stage of change. As such, her goal is to continue with weight loss efforts. She has agreed to the Category 3 Plan.   Exercise goals: Kendra Baird will continue her current exercise regimen.   Behavioral modification strategies: increasing lean protein intake, meal planning and cooking strategies, travel eating strategies, holiday eating strategies  and planning for success.  Kendra Baird has agreed to follow-up with our clinic in 2 weeks. She was informed of the importance of frequent follow-up visits to maximize her success with intensive lifestyle modifications for her multiple health conditions.   Kendra Baird was informed we would discuss her lab results at her next visit unless there is a critical issue that needs to be addressed sooner. Kendra Baird agreed to keep her next visit at the agreed upon time to discuss these results.  Objective:   Blood pressure 107/77, pulse 67, temperature 97.9 F (36.6 C), temperature source Oral, height 5\' 6"  (1.676 m), weight 217 lb (98.4 kg), SpO2 99 %, currently breastfeeding. Body mass index is 35.02 kg/m.  General: Cooperative, alert, well developed, in no acute  distress. HEENT: Conjunctivae and lids unremarkable. Cardiovascular: Regular rhythm.  Lungs: Normal work of breathing. Neurologic: No focal deficits.   Lab Results  Component Value Date   CREATININE 0.80  02/26/2020   BUN 13 02/26/2020   NA 141 02/26/2020   K 4.3 02/26/2020   CL 102 02/26/2020   CO2 19 (L) 02/26/2020   Lab Results  Component Value Date   ALT 11 02/26/2020   AST 15 02/26/2020   ALKPHOS 76 02/26/2020   BILITOT 0.7 02/26/2020   Lab Results  Component Value Date   HGBA1C 5.1 02/26/2020   Lab Results  Component Value Date   INSULIN 2.5 (L) 02/26/2020   Lab Results  Component Value Date   TSH 0.560 06/10/2020   Lab Results  Component Value Date   CHOL 166 02/26/2020   HDL 68 02/26/2020   LDLCALC 89 02/26/2020   TRIG 41 02/26/2020   Lab Results  Component Value Date   WBC 7.5 02/26/2020   HGB 14.4 02/26/2020   HCT 41.7 02/26/2020   MCV 88 02/26/2020   PLT 214 02/26/2020   Lab Results  Component Value Date   IRON 87 02/26/2020   TIBC 319 02/26/2020   FERRITIN 71 02/26/2020   Attestation Statements:   Reviewed by clinician on day of visit: allergies, medications, problem list, medical history, surgical history, family history, social history, and previous encounter notes.  I, Marianna Payment, am acting as Energy manager for [employer], NP-C   I have reviewed the above documentation for accuracy and completeness, and I agree with the above. -  Julaine Fusi, NP

## 2020-07-10 LAB — COMPREHENSIVE METABOLIC PANEL
ALT: 12 IU/L (ref 0–32)
AST: 12 IU/L (ref 0–40)
Albumin/Globulin Ratio: 1.8 (ref 1.2–2.2)
Albumin: 4.3 g/dL (ref 3.8–4.8)
Alkaline Phosphatase: 109 IU/L (ref 48–121)
BUN/Creatinine Ratio: 17 (ref 9–23)
BUN: 13 mg/dL (ref 6–20)
Bilirubin Total: 0.4 mg/dL (ref 0.0–1.2)
CO2: 24 mmol/L (ref 20–29)
Calcium: 9.5 mg/dL (ref 8.7–10.2)
Chloride: 105 mmol/L (ref 96–106)
Creatinine, Ser: 0.75 mg/dL (ref 0.57–1.00)
GFR calc Af Amer: 123 mL/min/{1.73_m2} (ref >59)
GFR calc non Af Amer: 107 mL/min/{1.73_m2} (ref >59)
Globulin, Total: 2.4 g/dL (ref 1.5–4.5)
Glucose: 84 mg/dL (ref 65–99)
Potassium: 4.5 mmol/L (ref 3.5–5.2)
Sodium: 141 mmol/L (ref 134–144)
Total Protein: 6.7 g/dL (ref 6.0–8.5)

## 2020-07-10 LAB — VITAMIN D 25 HYDROXY (VIT D DEFICIENCY, FRACTURES): Vit D, 25-Hydroxy: 19.8 ng/mL — ABNORMAL LOW (ref 30.0–100.0)

## 2020-07-28 ENCOUNTER — Ambulatory Visit (INDEPENDENT_AMBULATORY_CARE_PROVIDER_SITE_OTHER): Payer: BC Managed Care – PPO | Admitting: Family Medicine

## 2020-07-28 ENCOUNTER — Encounter (INDEPENDENT_AMBULATORY_CARE_PROVIDER_SITE_OTHER): Payer: Self-pay | Admitting: Family Medicine

## 2020-07-28 ENCOUNTER — Other Ambulatory Visit: Payer: Self-pay

## 2020-07-28 VITALS — BP 113/78 | HR 65 | Temp 97.7°F | Ht 66.0 in | Wt 219.0 lb

## 2020-07-28 DIAGNOSIS — Z9189 Other specified personal risk factors, not elsewhere classified: Secondary | ICD-10-CM | POA: Diagnosis not present

## 2020-07-28 DIAGNOSIS — E559 Vitamin D deficiency, unspecified: Secondary | ICD-10-CM | POA: Diagnosis not present

## 2020-07-28 DIAGNOSIS — Z6835 Body mass index (BMI) 35.0-35.9, adult: Secondary | ICD-10-CM

## 2020-07-28 DIAGNOSIS — F3289 Other specified depressive episodes: Secondary | ICD-10-CM | POA: Diagnosis not present

## 2020-07-28 MED ORDER — TOPIRAMATE 25 MG PO TABS: 25.0000 mg | ORAL_TABLET | Freq: Every day | ORAL | 0 refills | Status: DC

## 2020-07-28 MED ORDER — VITAMIN D (ERGOCALCIFEROL) 1.25 MG (50000 UNIT) PO CAPS: 50000.0000 [IU] | ORAL_CAPSULE | ORAL | 0 refills | Status: DC

## 2020-07-28 NOTE — Progress Notes (Addendum)
PLEASE NOTE:   I, Thomasene Lot, DO, am the author of this office visit note, however, due to a computer glitch, I am unable to take it over as "the author" of it and sign it as such.    Chief Complaint:   OBESITY Kendra Baird is here to discuss her progress with her obesity treatment plan along with follow-up of her obesity related diagnoses. Kendra Baird is on the Category 3 Plan and states she is following her eating plan approximately 90% of the time. Kendra Baird states she is running for 30-40 minutes 3 times per week.  Today's visit was #: 9 Starting weight: 222 lbs Starting date: 02/26/2020 Today's weight: 219 lbs Today's date: 07/28/2020 Total lbs lost to date: 3 lbs Total lbs lost since last in-office visit: 0  Interim History: Kendra Baird has a 31-year-old at home now, and she is not pumping/breast feeding.  If she is not following Category 3, she journals 1300-1700 calories and 95+ grams of protein per day per Dr. Philis Pique last note.  She says she is not skipping meals.  She says she is doing intervals of running/walking for 30-40 minutes 3 days per week.  She recently changed jobs and says her job is less stressful now.  She eats with family on weekends and says she eats mor carbs than she should take in.  Subjective:   1. Vitamin D deficiency Kendra Baird's Vitamin D level was 19.8 on 07/09/2020. She is currently taking prescription vitamin D 50,000 IU each week. She denies nausea, vomiting or muscle weakness.  She endorses fatigue and says she is a little tired at times.  2. Other depression, with emotional eating Kendra Baird is struggling with emotional eating and using food for comfort to the extent that it is negatively impacting her health. She has been working on behavior modification techniques to help reduce her emotional eating and has been somewhat successful. She shows no sign of suicidal or homicidal ideations.  3. At risk for anxiety Kendra Baird is at risk of developing anxiety due to  stress, personal and or family history or current situation.  Assessment/Plan:   1. Vitamin D deficiency Low Vitamin D level contributes to fatigue and are associated with obesity, breast, and colon cancer. She agrees to continue to take prescription Vitamin D @50 ,000 IU every week and will follow-up for routine testing of Vitamin D, at least 2-3 times per year to avoid over-replacement. - Vitamin D, Ergocalciferol, (DRISDOL) 1.25 MG (50000 UNIT) CAPS capsule; Take 1 capsule (50,000 Units total) by mouth every 7 (seven) days.  Dispense: 4 capsule; Refill: 0  2. Other depression, with emotional eating Behavior modification techniques were discussed today to help Kendra Baird deal with her emotional/non-hunger eating behaviors.  Orders and follow up as documented in patient record.  Kendra Baird will start Topamax, as per below. - topiramate (TOPAMAX) 25 MG tablet; Take 1 tablet (25 mg total) by mouth at bedtime.  Dispense: 30 tablet; Refill: 0  3. At risk for anxiety Kendra Baird was given approximately 15 minutes of anxiety risk counseling today. We discussed the importance of a healthy work life balance, a healthy relationship with food and a good support system.  Repetitive spaced learning was employed today to elicit superior memory formation and behavioral change.  4. Class 2 severe obesity with serious comorbidity and body mass index (BMI) of 35.0 to 35.9 in adult, unspecified obesity type (HCC) Kendra Baird is currently in the action stage of change. As such, her goal is to  continue with weight loss efforts. She has agreed to the Category 3 Plan.   Exercise goals: As is.  Behavioral modification strategies: increasing lean protein intake, increasing water intake, meal planning and cooking strategies and planning for success.  Kendra Baird has agreed to follow-up with our clinic in 2 weeks. She was informed of the importance of frequent follow-up visits to maximize her success with intensive lifestyle modifications  for her multiple health conditions.   Objective:   Blood pressure 113/78, pulse 65, temperature 97.7 F (36.5 C), height 5\' 6"  (1.676 m), weight (!) 219 lb (99.3 kg), SpO2 97 %, currently breastfeeding. Body mass index is 35.35 kg/m.  General: Cooperative, alert, well developed, in no acute distress. HEENT: Conjunctivae and lids unremarkable. Cardiovascular: Regular rhythm.  Lungs: Normal work of breathing. Neurologic: No focal deficits.   Lab Results  Component Value Date   CREATININE 0.75 07/09/2020   BUN 13 07/09/2020   NA 141 07/09/2020   K 4.5 07/09/2020   CL 105 07/09/2020   CO2 24 07/09/2020   Lab Results  Component Value Date   ALT 12 07/09/2020   AST 12 07/09/2020   ALKPHOS 109 07/09/2020   BILITOT 0.4 07/09/2020   Lab Results  Component Value Date   HGBA1C 5.1 02/26/2020   Lab Results  Component Value Date   INSULIN 2.5 (L) 02/26/2020   Lab Results  Component Value Date   TSH 0.560 06/10/2020   Lab Results  Component Value Date   CHOL 166 02/26/2020   HDL 68 02/26/2020   LDLCALC 89 02/26/2020   TRIG 41 02/26/2020   Lab Results  Component Value Date   WBC 7.5 02/26/2020   HGB 14.4 02/26/2020   HCT 41.7 02/26/2020   MCV 88 02/26/2020   PLT 214 02/26/2020   Lab Results  Component Value Date   IRON 87 02/26/2020   TIBC 319 02/26/2020   FERRITIN 71 02/26/2020   Attestation Statements:   Reviewed by clinician on day of visit: allergies, medications, problem list, medical history, surgical history, family history, social history, and previous encounter notes.  I, 04/27/2020, CMA, am acting as Insurance claims handler for Energy manager, DO.  I have reviewed the above documentation for accuracy and completeness, and I agree with the above. [employer], DO

## 2020-08-14 ENCOUNTER — Ambulatory Visit (INDEPENDENT_AMBULATORY_CARE_PROVIDER_SITE_OTHER): Payer: BC Managed Care – PPO | Admitting: Family Medicine

## 2020-08-14 ENCOUNTER — Other Ambulatory Visit: Payer: Self-pay

## 2020-08-14 VITALS — BP 116/80 | HR 65 | Temp 97.9°F | Ht 66.0 in | Wt 217.0 lb

## 2020-08-14 DIAGNOSIS — R519 Headache, unspecified: Secondary | ICD-10-CM

## 2020-08-14 DIAGNOSIS — E559 Vitamin D deficiency, unspecified: Secondary | ICD-10-CM | POA: Diagnosis not present

## 2020-08-14 DIAGNOSIS — Z6835 Body mass index (BMI) 35.0-35.9, adult: Secondary | ICD-10-CM

## 2020-08-14 DIAGNOSIS — Z9189 Other specified personal risk factors, not elsewhere classified: Secondary | ICD-10-CM

## 2020-08-14 DIAGNOSIS — G8929 Other chronic pain: Secondary | ICD-10-CM

## 2020-08-14 MED ORDER — TOPIRAMATE 25 MG PO TABS: 25.0000 mg | ORAL_TABLET | Freq: Every day | ORAL | 0 refills | Status: DC

## 2020-08-14 MED ORDER — VITAMIN D (ERGOCALCIFEROL) 1.25 MG (50000 UNIT) PO CAPS: 50000.0000 [IU] | ORAL_CAPSULE | ORAL | 0 refills | Status: DC

## 2020-08-18 NOTE — Progress Notes (Signed)
Chief Complaint:   OBESITY Kendra Baird is here to discuss her progress with her obesity treatment plan along with follow-up of her obesity related diagnoses. [name] is on the Category 3 Plan and states she is following her eating plan approximately 95% of the time. [name] states she is doing interval training and yoga for 35 minutes 3-4 times per week.  Today's visit was #: 10 Starting weight: 222 lbs Starting date: 02/26/2020 Today's weight: 217 lbs Today's date: 08/14/2020 Total lbs lost to date: 5 lbs Total lbs lost since last in-office visit: 2 lbs  Interim History:  At [facility] last office visit, we started her on Topamax and vitamin D.  She has chronic headaches and Topamax would be covered.  She says her headaches are less now.  She feels she has less cravings.  She is tolerating it well, without side effects.    Subjective:   1. Chronic nonintractable headache, unspecified headache type Since starting Topamax at last visit, she has not had headaches, she is not needing Imitrex.  She also says that her cravings are better controlled than they were prior.  She says she is happy with the effects of the medication.    2. Vitamin D deficiency Kendra Baird's Vitamin D level was 19.8 on 07/09/2020. She is currently taking prescription vitamin D 50,000 IU each week. She denies nausea, vomiting or muscle weakness.  3. At risk for dehydration Kendra Baird is at risk for dehydration due to inadequate water intake.  Education done and she will calculate how mayn ounces per day she drinks going forward.  Assessment/Plan:   1. Chronic nonintractable headache, unspecified headache type Will refill Topamax today.  Hydrate, stress management, exercise, continue medication.  Follow prudent nutritional plan and weight loss.  - Refill topiramate (TOPAMAX) 25 MG tablet; Take 1 tablet (25 mg total) by mouth at bedtime.  Dispense: 30 tablet; Refill: 0  2. Vitamin D deficiency Low Vitamin D level contributes to  fatigue and are associated with obesity, breast, and colon cancer. She agrees to continue to take prescription Vitamin D @50 ,000 IU every week and will follow-up for routine testing of Vitamin D, at least 2-3 times per year to avoid over-replacement. Recheck vitamin D level in 3 months.  Continue prudent nutritional plan and weight loss.  - Refill Vitamin D, Ergocalciferol, (DRISDOL) 1.25 MG (50000 UNIT) CAPS capsule; Take 1 capsule (50,000 Units total) by mouth every 7 (seven) days.  Dispense: 4 capsule; Refill: 0  3. At risk for dehydration [name] was given approximately 15 minutes dehydration prevention counseling today. [name] is at risk for dehydration due to weight loss and current medication(s). She was encouraged to hydrate and monitor fluid status to avoid dehydration as well as weight loss plateaus.   4. Class 2 severe obesity with serious comorbidity and body mass index (BMI) of 35.0 to 35.9 in adult, unspecified obesity type (HCC) Kendra Baird is currently in the action stage of change. As such, her goal is to continue with weight loss efforts. She has agreed to the Category 3 Plan.   Exercise goals: As is.  Behavioral modification strategies: increasing lean protein intake, increasing water intake, planning for success and keeping a strict food journal.  [name] has agreed to follow-up with our clinic in 2 weeks. She was informed of the importance of frequent follow-up visits to maximize her success with intensive lifestyle modifications for her multiple health conditions.   Objective:   Blood pressure 116/80, pulse 65, temperature 97.9 F (36.6  C), height 5\' 6"  (1.676 m), weight 217 lb (98.4 kg), SpO2 98 %, currently breastfeeding. Body mass index is 35.02 kg/m.  General: Cooperative, alert, well developed, in no acute distress. HEENT: Conjunctivae and lids unremarkable. Cardiovascular: Regular rhythm.  Lungs: Normal work of breathing. Neurologic: No focal deficits.   Lab Results    Component Value Date   CREATININE 0.75 07/09/2020   BUN 13 07/09/2020   NA 141 07/09/2020   K 4.5 07/09/2020   CL 105 07/09/2020   CO2 24 07/09/2020   Lab Results  Component Value Date   ALT 12 07/09/2020   AST 12 07/09/2020   ALKPHOS 109 07/09/2020   BILITOT 0.4 07/09/2020   Lab Results  Component Value Date   HGBA1C 5.1 02/26/2020   Lab Results  Component Value Date   INSULIN 2.5 (L) 02/26/2020   Lab Results  Component Value Date   TSH 0.560 06/10/2020   Lab Results  Component Value Date   CHOL 166 02/26/2020   HDL 68 02/26/2020   LDLCALC 89 02/26/2020   TRIG 41 02/26/2020   Lab Results  Component Value Date   WBC 7.5 02/26/2020   HGB 14.4 02/26/2020   HCT 41.7 02/26/2020   MCV 88 02/26/2020   PLT 214 02/26/2020   Lab Results  Component Value Date   IRON 87 02/26/2020   TIBC 319 02/26/2020   FERRITIN 71 02/26/2020   Attestation Statements:   Reviewed by clinician on day of visit: allergies, medications, problem list, medical history, surgical history, family history, social history, and previous encounter notes.  I, 04/27/2020, CMA, am acting as Insurance claims handler for Energy manager, DO.  I have reviewed the above documentation for accuracy and completeness, and I agree with the above. [employer], DO

## 2020-09-03 ENCOUNTER — Encounter (INDEPENDENT_AMBULATORY_CARE_PROVIDER_SITE_OTHER): Payer: Self-pay | Admitting: Family Medicine

## 2020-09-03 ENCOUNTER — Ambulatory Visit (INDEPENDENT_AMBULATORY_CARE_PROVIDER_SITE_OTHER): Payer: BC Managed Care – PPO | Admitting: Family Medicine

## 2020-09-03 ENCOUNTER — Other Ambulatory Visit: Payer: Self-pay

## 2020-09-03 VITALS — BP 112/73 | HR 65 | Temp 98.1°F | Ht 66.0 in | Wt 217.0 lb

## 2020-09-03 DIAGNOSIS — E559 Vitamin D deficiency, unspecified: Secondary | ICD-10-CM | POA: Diagnosis not present

## 2020-09-03 DIAGNOSIS — Z6835 Body mass index (BMI) 35.0-35.9, adult: Secondary | ICD-10-CM | POA: Diagnosis not present

## 2020-09-03 DIAGNOSIS — G43809 Other migraine, not intractable, without status migrainosus: Secondary | ICD-10-CM

## 2020-09-03 DIAGNOSIS — Z9189 Other specified personal risk factors, not elsewhere classified: Secondary | ICD-10-CM | POA: Diagnosis not present

## 2020-09-03 MED ORDER — TOPIRAMATE 50 MG PO TABS: 50.0000 mg | ORAL_TABLET | Freq: Every day | ORAL | 0 refills | Status: DC

## 2020-09-03 MED ORDER — VITAMIN D (ERGOCALCIFEROL) 1.25 MG (50000 UNIT) PO CAPS: 50000.0000 [IU] | ORAL_CAPSULE | ORAL | 0 refills | Status: DC

## 2020-09-03 NOTE — Progress Notes (Signed)
Chief Complaint:   OBESITY Kendra Baird is here to discuss her progress with her obesity treatment plan along with follow-up of her obesity related diagnoses. Kendra Baird is on the Category 3 Plan and states she is following her eating plan approximately 90-95% of the time. Kendra Baird states she is interval training and yoga for 35-60 minutes 3 times per week.  Today's visit was #: 11 Starting weight: 222 lbs Starting date: 02/26/2020 Today's weight: 217 lbs Today's date: 09/09/2020 Total lbs lost to date: 5 lbs Total lbs lost since last in-office visit: 0  Interim History: Kendra Baird says she is following the plan very closely.  She says there were only 4 meals in total where she may have deviated a little bit.  She says she makes smart choices during those times, getting higher protein options.  She loves the [place] [employer].  No cravings recently, but some around her menstrual cycle.  We discussed her drinking more water, but she has not implemented that yet.  Subjective:   1. Other migraine without status migrainosus, not intractable Kendra Baird has had 2 episodes of headaches since she was last seen.  She sees Neurology for headaches, but her next appointment is in 1-2 months.  Topamax was started 2 office visits ago and is okay.  2. Vitamin D deficiency Kendra Baird's Vitamin D level was 33.5 on 06/10/2020. She is currently taking prescription vitamin D 50,000 IU each week. She denies nausea, vomiting or muscle weakness.  Denies difficulty with medication.  She is taking it regularly.  3. At risk for dehydration Kendra Baird is at higher than average risk of dehydration.  Kendra Baird was given more than 12 minutes of proper hydration counseling today.   We discussed the signs and symptoms of dehydration some of which may include muscle cramping, constipation or even orthostatic symptoms.   Counseling on the prevention of dehydration was also provided today.    Kendra Baird is at risk for dehydration due to weight loss,  lifestyle and behavorial habits and possibly due to taking certain medication(s).  She was encouraged to adequately hydrate and monitor fluid status to avoid dehydration as well as weight loss plateaus.  Unless pre-existing renal or cardiopulmonary conditions exist which pt was told to limit their fluid intake, I recommended roughly one half of their weight in pounds to be the approximate ounces of non-caloric, non-caffeinated beverages they should drink per day; including more if they are engaging in exercise.  Assessment/Plan:   1. Other migraine without status migrainosus, not intractable Increase Topamax dose today from 25 to 50 mg.  She knows this will hep with her weight loss and migraines.  Follow-up with Neurology as scheduled.  -Increase topiramate (TOPAMAX) 50 MG tablet; Take 1 tablet (50 mg total) by mouth at bedtime.  Dispense: 30 tablet; Refill: 0  2. Vitamin D deficiency Low Vitamin D level contributes to fatigue and are associated with obesity, breast, and colon cancer. She agrees to continue to take prescription Vitamin D @50 ,000 IU every week and will follow-up for routine testing of Vitamin D, at least 2-3 times per year to avoid over-replacement.  Recheck level in mid October or so.  -Refill Vitamin D, Ergocalciferol, (DRISDOL) 1.25 MG (50000 UNIT) CAPS capsule; Take 1 capsule (50,000 Units total) by mouth every 7 (seven) days.  Dispense: 4 capsule; Refill: 0  3. At risk for dehydration I again reiterated to [facility] the importance of proper hydration.    Kendra Baird is at higher than average risk of  dehydration.  Kendra Baird was given more than 12 minutes of proper hydration counseling today.   We discussed the signs and symptoms of dehydration some of which may include muscle cramping, constipation or even orthostatic symptoms.   Counseling on the prevention of dehydration was also provided today.    Kendra Baird is at risk for dehydration due to weight loss, lifestyle and behavorial habits and  possibly due to taking certain medication(s).  She was encouraged to adequately hydrate and monitor fluid status to avoid dehydration as well as weight loss plateaus.  Unless pre-existing renal or cardiopulmonary conditions exist which pt was told to limit their fluid intake, I recommended roughly one half of their weight in pounds to be the approximate ounces of non-caloric, non-caffeinated beverages they should drink per day; including more if they are engaging in exercise.  4. Class 2 severe obesity with serious comorbidity and body mass index (BMI) of 35.0 to 35.9 in adult, unspecified obesity type (HCC) Kendra Baird is currently in the action stage of change. As such, her goal is to continue with weight loss efforts. She has agreed to the Category 3 Plan.   Exercise goals: As is.  Behavioral modification strategies: increasing lean protein intake, increasing water intake, celebration eating strategies and planning for success.  Kendra Baird has agreed to follow-up with our clinic in 2 weeks. She was informed of the importance of frequent follow-up visits to maximize her success with intensive lifestyle modifications for her multiple health conditions.   Objective:   Blood pressure 112/73, pulse 65, temperature 98.1 F (36.7 C), height 5\' 6"  (1.676 m), weight 217 lb (98.4 kg), SpO2 99 %, currently breastfeeding. Body mass index is 35.02 kg/m.  General: Cooperative, alert, well developed, in no acute distress. HEENT: Conjunctivae and lids unremarkable. Cardiovascular: Regular rhythm.  Lungs: Normal work of breathing. Neurologic: No focal deficits.   Lab Results  Component Value Date   CREATININE 0.75 07/09/2020   BUN 13 07/09/2020   NA 141 07/09/2020   K 4.5 07/09/2020   CL 105 07/09/2020   CO2 24 07/09/2020   Lab Results  Component Value Date   ALT 12 07/09/2020   AST 12 07/09/2020   ALKPHOS 109 07/09/2020   BILITOT 0.4 07/09/2020   Lab Results  Component Value Date   HGBA1C 5.1  02/26/2020   Lab Results  Component Value Date   INSULIN 2.5 (L) 02/26/2020   Lab Results  Component Value Date   TSH 0.560 06/10/2020   Lab Results  Component Value Date   CHOL 166 02/26/2020   HDL 68 02/26/2020   LDLCALC 89 02/26/2020   TRIG 41 02/26/2020   Lab Results  Component Value Date   WBC 7.5 02/26/2020   HGB 14.4 02/26/2020   HCT 41.7 02/26/2020   MCV 88 02/26/2020   PLT 214 02/26/2020   Lab Results  Component Value Date   IRON 87 02/26/2020   TIBC 319 02/26/2020   FERRITIN 71 02/26/2020   Attestation Statements:   Reviewed by clinician on day of visit: allergies, medications, problem list, medical history, surgical history, family history, social history, and previous encounter notes.  I, 04/27/2020, CMA, am acting as Insurance claims handler for Energy manager, DO.  I have reviewed the above documentation for accuracy and completeness, and I agree with the above. [employer], DO

## 2020-09-17 ENCOUNTER — Ambulatory Visit (INDEPENDENT_AMBULATORY_CARE_PROVIDER_SITE_OTHER): Payer: BC Managed Care – PPO | Admitting: Family Medicine

## 2020-09-17 ENCOUNTER — Encounter (INDEPENDENT_AMBULATORY_CARE_PROVIDER_SITE_OTHER): Payer: Self-pay | Admitting: Family Medicine

## 2020-09-17 ENCOUNTER — Other Ambulatory Visit: Payer: Self-pay

## 2020-09-17 VITALS — BP 106/72 | HR 92 | Temp 98.3°F | Ht 66.0 in | Wt 213.0 lb

## 2020-09-17 DIAGNOSIS — G43809 Other migraine, not intractable, without status migrainosus: Secondary | ICD-10-CM | POA: Diagnosis not present

## 2020-09-17 DIAGNOSIS — Z9189 Other specified personal risk factors, not elsewhere classified: Secondary | ICD-10-CM | POA: Diagnosis not present

## 2020-09-17 DIAGNOSIS — E559 Vitamin D deficiency, unspecified: Secondary | ICD-10-CM | POA: Diagnosis not present

## 2020-09-17 DIAGNOSIS — Z6834 Body mass index (BMI) 34.0-34.9, adult: Secondary | ICD-10-CM | POA: Diagnosis not present

## 2020-09-17 DIAGNOSIS — E669 Obesity, unspecified: Secondary | ICD-10-CM | POA: Diagnosis not present

## 2020-09-17 MED ORDER — TOPIRAMATE 50 MG PO TABS: ORAL_TABLET | ORAL | 0 refills | Status: DC

## 2020-09-17 MED ORDER — VITAMIN D (ERGOCALCIFEROL) 1.25 MG (50000 UNIT) PO CAPS: 50000.0000 [IU] | ORAL_CAPSULE | ORAL | 0 refills | Status: DC

## 2020-09-18 DIAGNOSIS — R05 Cough: Secondary | ICD-10-CM | POA: Diagnosis not present

## 2020-09-19 DIAGNOSIS — Z03818 Encounter for observation for suspected exposure to other biological agents ruled out: Secondary | ICD-10-CM | POA: Diagnosis not present

## 2020-09-19 DIAGNOSIS — Z20822 Contact with and (suspected) exposure to covid-19: Secondary | ICD-10-CM | POA: Diagnosis not present

## 2020-09-20 DIAGNOSIS — J019 Acute sinusitis, unspecified: Secondary | ICD-10-CM | POA: Diagnosis not present

## 2020-09-21 NOTE — Progress Notes (Signed)
Chief Complaint:   OBESITY Kendra Baird is here to discuss her progress with her obesity treatment plan along with follow-up of her obesity related diagnoses. Kendra Baird is on the Category 3 Plan and states she is following her eating plan approximately 90% of the time. Kendra Baird states she is doing yoga and interval training for 40-60 minutes 3 times per week.  Today's visit was #: 12 Starting weight: 222 lbs Starting date: 02/26/2020 Today's weight: 213 lbs Today's date: 09/17/2020 Total lbs lost to date: 9 lbs Total lbs lost since last in-office visit: 4 lbs  Interim History: Kendra Baird says she is doing well.  She feels that the increased Topamax dose really helped her with her cravings.  She likes the plan and denies issues.  She says she is eating and weighing all her protein.  Her hunger is controlled.  Subjective:   1. Other migraine without status migrainosus, not intractable At last office visit, we increased her Topamax dose to 25 mg in the morning and 50 mg at night.  She has not had any headaches since increasing the dose.  Also, she says her cravings have decreased a lot.    2. Vitamin D deficiency Kendra Baird's Vitamin D level was 19.8 on 07/09/2020. She is currently taking prescription vitamin D 50,000 IU each week. She denies nausea, vomiting or muscle weakness.  Still with some fatigue, but she feels the vitamin D is helping with that and she is taking OTC vitamin D along with her weekly prescription.    3. At risk for impaired metabolic function Due to Kendra Baird's current state of health and medical condition(s), they are at a significantly higher risk for impaired metabolic function.   This further also puts the patient at much greater risk to also subsequently develop cardiopulmonary conditions that can negatively affect patient's quality of life as well.  At least 8 minutes was spent on counseling Kendra Baird about these concerns today and I stressed the importance of reversing these risks  factors.   Initial goal is to lose at least 5-10% of starting weight to help reduce risk factors.   Counseling: Intensive lifestyle modifications discussed with Kendra Baird as most appropriate first line treatment.  she will continue to work on diet, exercise and weight loss efforts.  We will continue to reassess these conditions on a fairly regular basis in an attempt to decrease patient's overall morbidity and mortality  Assessment/Plan:   1. Other migraine without status migrainosus, not intractable Continue 75 mg Topamax per day.  Follow meal plan.  Use Imitrex as needed, but has not needed any since increasing Topamax dose.  -Refill topiramate (TOPAMAX) 50 MG tablet; 25mg  qd plus 50mg  q hs  Dispense: 45 tablet; Refill: 0  2. Vitamin D deficiency Low Vitamin D level contributes to fatigue and are associated with obesity, breast, and colon cancer. She agrees to continue to take prescription Vitamin D @50 ,000 IU every week and will follow-up for routine testing of Vitamin D, at least 2-3 times per year to avoid over-replacement.  Add 2,000 IU daily to regimen.  Recheck levels in 2 months.  -Refill Vitamin D, Ergocalciferol, (DRISDOL) 1.25 MG (50000 UNIT) CAPS capsule; Take 1 capsule (50,000 Units total) by mouth every 7 (seven) days.  Dispense: 4 capsule; Refill: 0  3. At risk for impaired metabolic function Due to Kendra Baird's current state of health and medical condition(s), they are at a significantly higher risk for impaired metabolic function.   This further also puts the patient  at much greater risk to also subsequently develop cardiopulmonary conditions that can negatively affect patient's quality of life as well.  At least 8 minutes was spent on counseling Kendra Baird about these concerns today and I stressed the importance of reversing these risks factors.   Initial goal is to lose at least 5-10% of starting weight to help reduce risk factors.   Counseling: Intensive lifestyle modifications discussed  with Kendra Baird as most appropriate first line treatment.  she will continue to work on diet, exercise and weight loss efforts.  We will continue to reassess these conditions on a fairly regular basis in an attempt to decrease patient's overall morbidity and mortality.  4. Class 1 obesity with serious comorbidity and body mass index (BMI) of 34.0 to 34.9 in adult, unspecified obesity type Kendra Baird is currently in the action stage of change. As such, her goal is to continue with weight loss efforts. She has agreed to the Category 3 Plan.   Exercise goals: For substantial health benefits, adults should do at least 150 minutes (2 hours and 30 minutes) a week of moderate-intensity, or 75 minutes (1 hour and 15 minutes) a week of vigorous-intensity aerobic physical activity, or an equivalent combination of moderate- and vigorous-intensity aerobic activity. Aerobic activity should be performed in episodes of at least 10 minutes, and preferably, it should be spread throughout the week.  Or more with her resistance training.  Behavioral modification strategies: increasing lean protein intake, increasing water intake and planning for success.  Kendra Baird has agreed to follow-up with our clinic in 2 weeks. She was informed of the importance of frequent follow-up visits to maximize her success with intensive lifestyle modifications for her multiple health conditions.   Objective:   Blood pressure 106/72, pulse 92, temperature 98.3 F (36.8 C), height 5\' 6"  (1.676 m), weight 213 lb (96.6 kg), SpO2 97 %, currently breastfeeding. Body mass index is 34.38 kg/m.  General: Cooperative, alert, well developed, in no acute distress. HEENT: Conjunctivae and lids unremarkable. Cardiovascular: Regular rhythm.  Lungs: Normal work of breathing. Neurologic: No focal deficits.   Lab Results  Component Value Date   CREATININE 0.75 07/09/2020   BUN 13 07/09/2020   NA 141 07/09/2020   K 4.5 07/09/2020   CL 105 07/09/2020    CO2 24 07/09/2020   Lab Results  Component Value Date   ALT 12 07/09/2020   AST 12 07/09/2020   ALKPHOS 109 07/09/2020   BILITOT 0.4 07/09/2020   Lab Results  Component Value Date   HGBA1C 5.1 02/26/2020   Lab Results  Component Value Date   INSULIN 2.5 (L) 02/26/2020   Lab Results  Component Value Date   TSH 0.560 06/10/2020   Lab Results  Component Value Date   CHOL 166 02/26/2020   HDL 68 02/26/2020   LDLCALC 89 02/26/2020   TRIG 41 02/26/2020   Lab Results  Component Value Date   WBC 7.5 02/26/2020   HGB 14.4 02/26/2020   HCT 41.7 02/26/2020   MCV 88 02/26/2020   PLT 214 02/26/2020   Lab Results  Component Value Date   IRON 87 02/26/2020   TIBC 319 02/26/2020   FERRITIN 71 02/26/2020   Attestation Statements:   Reviewed by clinician on day of visit: allergies, medications, problem list, medical history, surgical history, family history, social history, and previous encounter notes.  I, 04/27/2020, CMA, am acting as Insurance claims handler for Energy manager, DO.  I have reviewed the above documentation for accuracy and completeness, and  I agree with the above. Thomasene Lot, DO

## 2020-09-26 DIAGNOSIS — R059 Cough, unspecified: Secondary | ICD-10-CM | POA: Diagnosis not present

## 2020-09-26 DIAGNOSIS — R062 Wheezing: Secondary | ICD-10-CM | POA: Diagnosis not present

## 2020-10-03 DIAGNOSIS — R0981 Nasal congestion: Secondary | ICD-10-CM | POA: Diagnosis not present

## 2020-10-03 DIAGNOSIS — R059 Cough, unspecified: Secondary | ICD-10-CM | POA: Diagnosis not present

## 2020-10-08 ENCOUNTER — Other Ambulatory Visit: Payer: Self-pay

## 2020-10-08 ENCOUNTER — Encounter (INDEPENDENT_AMBULATORY_CARE_PROVIDER_SITE_OTHER): Payer: Self-pay | Admitting: Adult Health

## 2020-10-08 ENCOUNTER — Telehealth (INDEPENDENT_AMBULATORY_CARE_PROVIDER_SITE_OTHER): Payer: BC Managed Care – PPO | Admitting: Adult Health

## 2020-10-08 ENCOUNTER — Telehealth (INDEPENDENT_AMBULATORY_CARE_PROVIDER_SITE_OTHER): Payer: Self-pay

## 2020-10-08 ENCOUNTER — Ambulatory Visit (INDEPENDENT_AMBULATORY_CARE_PROVIDER_SITE_OTHER): Payer: BC Managed Care – PPO | Admitting: Family Medicine

## 2020-10-08 VITALS — Wt 214.0 lb

## 2020-10-08 DIAGNOSIS — G43809 Other migraine, not intractable, without status migrainosus: Secondary | ICD-10-CM | POA: Diagnosis not present

## 2020-10-08 DIAGNOSIS — E669 Obesity, unspecified: Secondary | ICD-10-CM

## 2020-10-08 DIAGNOSIS — E559 Vitamin D deficiency, unspecified: Secondary | ICD-10-CM | POA: Diagnosis not present

## 2020-10-08 DIAGNOSIS — Z6834 Body mass index (BMI) 34.0-34.9, adult: Secondary | ICD-10-CM

## 2020-10-08 MED ORDER — TOPIRAMATE 50 MG PO TABS: ORAL_TABLET | ORAL | 0 refills | Status: DC

## 2020-10-08 MED ORDER — VITAMIN D (ERGOCALCIFEROL) 1.25 MG (50000 UNIT) PO CAPS: 50000.0000 [IU] | ORAL_CAPSULE | ORAL | 0 refills | Status: DC

## 2020-10-08 NOTE — Telephone Encounter (Signed)
Pt contacted. Verbal telehealth consent obtained to conduct visit via video call.   Reyden Smith LPN 

## 2020-10-09 NOTE — Progress Notes (Signed)
TeleHealth Visit:  Due to the COVID-19 pandemic, this visit was completed with telemedicine (audio/video) technology to reduce patient and provider exposure as well as to preserve personal protective equipment.   Kendra Baird has verbally consented to this TeleHealth visit. The patient is located at home, the provider is located at the [facility] office. The participants in this visit include the listed provider and patient. The visit was conducted today via video.  Chief Complaint: OBESITY Kendra Baird is here to discuss her progress with her obesity treatment plan along with follow-up of her obesity related diagnoses. Kendra Baird is on the Category 3 Plan and states she is following her eating plan approximately 75% of the time. Kendra Baird states she is exercising 0 minutes 0 times per week.  Today's visit was #: 13 Starting weight: 222 lbs Starting date: 02/26/2020  Interim History: Both of Kendra Baird's children are acutely ill Kendra Baird, her 44-year-old, has Hand/Foot/Mouth and Kendra Baird, her 50-year-old, has severe allergies with GI upset. Kendra Baird herself is on antibiotics for a respiratory infection and is currently on day 5 of 10 of therapy. She estimates to drink a gallon of water a day and has been following a combination of Category 3/PC/[place] during this challenging period.  Subjective:   Vitamin D deficiency. Vitamin D level on 07/09/2020 was 19.8, below goal of 50. Kendra Baird is on Ergocalciferol. No nausea, vomiting, or muscle weakness.    Ref. Range 07/09/2020 15:18  Vitamin D, 25-Hydroxy Latest Ref Range: 30.0 - 100.0 ng/mL 19.8 (L)   Other migraine without status migrainosus, not intractable. Kendra Baird reports a significant decrease in migraine frequency and severity since starting Topiramate. She also reports a reduction in food cravings. She has a remote history of kidney stones greater than 10 years ago, believed to be caused by large consumption of sweet tea at time of nephrolithiasis-  she now drinks greater than a gallon of water a day and abstains from tea.  Assessment/Plan:   Vitamin D deficiency. Low Vitamin D level contributes to fatigue and are associated with obesity, breast, and colon cancer. She was given a refill of her Vitamin D, Ergocalciferol, (DRISDOL) 1.25 MG (50000 UNIT) CAPS capsule every week #4 with 0 refills and will follow-up for routine testing of Vitamin D, at least 2-3 times per year to avoid over-replacement.   Other migraine without status migrainosus, not intractable. Refill was given for topiramate (TOPAMAX) 50 MG tablet QHS and 25 mg QAM #45 with 0 refills.  Class 1 obesity with serious comorbidity and body mass index (BMI) of 34.0 to 34.9 in adult, unspecified obesity type.  Kendra Baird is currently in the action stage of change. As such, her goal is to continue with weight loss efforts. She has agreed to the Category 3 Plan.   Exercise goals: No exercise has been prescribed at this time.  Behavioral modification strategies: increasing lean protein intake, decreasing simple carbohydrates, no skipping meals, meal planning and cooking strategies, better snacking choices and planning for success.  Kendra Baird has agreed to follow-up with our clinic in 2 weeks. She was informed of the importance of frequent follow-up visits to maximize her success with intensive lifestyle modifications for her multiple health conditions.  Objective:   VITALS: Per patient if applicable, see vitals. GENERAL: Alert and in no acute distress. CARDIOPULMONARY: No increased WOB. Speaking in clear sentences.  PSYCH: Pleasant and cooperative. Speech normal rate and rhythm. Affect is appropriate. Insight and judgement are appropriate. Attention is focused, linear, and appropriate.  NEURO: Oriented as arrived to appointment on time with no prompting.   Lab Results  Component Value Date   CREATININE 0.75 07/09/2020   BUN 13 07/09/2020   NA 141 07/09/2020   K 4.5 07/09/2020   CL  105 07/09/2020   CO2 24 07/09/2020   Lab Results  Component Value Date   ALT 12 07/09/2020   AST 12 07/09/2020   ALKPHOS 109 07/09/2020   BILITOT 0.4 07/09/2020   Lab Results  Component Value Date   HGBA1C 5.1 02/26/2020   Lab Results  Component Value Date   INSULIN 2.5 (L) 02/26/2020   Lab Results  Component Value Date   TSH 0.560 06/10/2020   Lab Results  Component Value Date   CHOL 166 02/26/2020   HDL 68 02/26/2020   LDLCALC 89 02/26/2020   TRIG 41 02/26/2020   Lab Results  Component Value Date   WBC 7.5 02/26/2020   HGB 14.4 02/26/2020   HCT 41.7 02/26/2020   MCV 88 02/26/2020   PLT 214 02/26/2020   Lab Results  Component Value Date   IRON 87 02/26/2020   TIBC 319 02/26/2020   FERRITIN 71 02/26/2020   Attestation Statements:   Reviewed by clinician on day of visit: allergies, medications, problem list, medical history, surgical history, family history, social history, and previous encounter notes.  I, Kendra Baird, am acting as Energy manager for [employer], NP-C   I have reviewed the above documentation for accuracy and completeness, and I agree with the above. - Kendra Baird d. Kendra Lewis, NP-C

## 2020-10-27 ENCOUNTER — Ambulatory Visit (INDEPENDENT_AMBULATORY_CARE_PROVIDER_SITE_OTHER): Payer: BC Managed Care – PPO | Admitting: Family Medicine

## 2020-10-27 ENCOUNTER — Encounter (INDEPENDENT_AMBULATORY_CARE_PROVIDER_SITE_OTHER): Payer: Self-pay | Admitting: Family Medicine

## 2020-10-27 ENCOUNTER — Other Ambulatory Visit: Payer: Self-pay

## 2020-10-27 VITALS — BP 104/69 | HR 84 | Temp 97.7°F | Ht 66.0 in | Wt 214.0 lb

## 2020-10-27 DIAGNOSIS — Z9189 Other specified personal risk factors, not elsewhere classified: Secondary | ICD-10-CM | POA: Diagnosis not present

## 2020-10-27 DIAGNOSIS — G43809 Other migraine, not intractable, without status migrainosus: Secondary | ICD-10-CM | POA: Diagnosis not present

## 2020-10-27 DIAGNOSIS — F3289 Other specified depressive episodes: Secondary | ICD-10-CM | POA: Diagnosis not present

## 2020-10-27 DIAGNOSIS — Z6834 Body mass index (BMI) 34.0-34.9, adult: Secondary | ICD-10-CM

## 2020-10-27 DIAGNOSIS — E559 Vitamin D deficiency, unspecified: Secondary | ICD-10-CM

## 2020-10-27 DIAGNOSIS — F32A Depression, unspecified: Secondary | ICD-10-CM | POA: Insufficient documentation

## 2020-10-27 DIAGNOSIS — E669 Obesity, unspecified: Secondary | ICD-10-CM

## 2020-10-27 MED ORDER — VITAMIN D (ERGOCALCIFEROL) 1.25 MG (50000 UNIT) PO CAPS: 50000.0000 [IU] | ORAL_CAPSULE | ORAL | 0 refills | Status: DC

## 2020-10-28 NOTE — Progress Notes (Signed)
Chief Complaint:   OBESITY Kendra Baird is here to discuss her progress with her obesity treatment plan along with follow-up of her obesity related diagnoses. Kendra Baird is on the Category 3 Plan and states she is following her eating plan approximately 90% of the time. Kendra Baird states she is doing yoga for 60 minutes 2 times per week.  Today's visit was #: 14 Starting weight: 222 lbs Starting date: 02/26/2020 Today's weight: 214 lbs Today's date: 10/27/2020 Total lbs lost to date: 8 lbs Total lbs lost since last in-office visit: +1 lb Total weight loss percentage to date: -3.60%   Interim History: Kendra Baird has been on amoxicillin and prednisone, to then Levaquin over the past 3-4 weeks.  She says that she has finally been feeling better over the past 5 days or so.  Denies more cough or congestion.  Just starting to get back on the plan over the past few day.  She is happy she did not gain because of the 10 days of steroids, etc.  She will get back on track.  No concerns.     Assessment/Plan:   Meds ordered this encounter  Medications  . Vitamin D, Ergocalciferol, (DRISDOL) 1.25 MG (50000 UNIT) CAPS capsule    Sig: Take 1 capsule (50,000 Units total) by mouth every 7 (seven) days.    Dispense:  4 capsule    Refill:  0     1. Other migraine without status migrainosus, not intractable She says that Topamax makes her drowsy, thus she takes a larger amount at night.  No migraines since she was last seen by [place].  Plan:  She will follow-up with Neurology for chronic care of her migraine headaches, but they have been very well controlled since we increased her Topamax to help with cravings.     2. Vitamin D deficiency Kendra Baird has a history of Vitamin D deficiency with resultant generalized fatigue as her primary symptom.  she is taking vitamin D 50,000 IU weekly for this deficiency and tolerating it well without side-effect.  Most recent Vitamin D lab reviewed-  level: 19.8 on  07/09/2020.  Plan:  - Reiterated importance of vitamin D (as well as calcium) to their health and wellbeing.  - Reminded pt that weight loss will likely improve availability of vitamin D - I recommend pt continue to take weekly prescription vit D 50,000 IU - Informed patient this may be a lifelong thing, and she was encouraged to continue to take the medicine until told otherwise.   - We will need to monitor levels regularly (every 3-4 mo on average) to keep levels within normal limits.  - pt's questions and concerns regarding this condition addressed. -Refill Vitamin D, Ergocalciferol, (DRISDOL) 1.25 MG (50000 UNIT) CAPS capsule; Take 1 capsule (50,000 Units total) by mouth every 7 (seven) days.  Dispense: 4 capsule; Refill: 0    3. Other depression, with emotional eating She has a history of cravings, and now they are essentially nonexistent on Topamax.  She does not feel hungry, but she is spacing out food and getting it all in within the day.  Plan:  Continue Topamax for her cravings/ emotional eating.  Discussed with her how to cope otherwise.  Increased activity for stress management.    4.  At risk metabolic syndrome Due to Kendra Baird's current state of health and medical condition(s), they are at a significantly higher risk for impaired metabolic function.   This further also puts the patient at much  greater risk to also subsequently develop cardiopulmonary conditions that can negatively affect patient's quality of life as well.  At least 9 minutes was spent on counseling Kendra Baird about these concerns today and I stressed the importance of reversing these risks factors.   Initial goal is to lose at least 5-10% of starting weight to help reduce risk factors.   Counseling: Intensive lifestyle modifications discussed with Kendra Baird as most appropriate first line treatment.  she will continue to work on diet, exercise and weight loss efforts.  We will continue to reassess these conditions on a fairly  regular basis in an attempt to decrease patient's overall morbidity and mortality    5. Class 1 obesity with serious comorbidity and body mass index (BMI) of 34.0 to 34.9 in adult, unspecified obesity type  Kendra Baird is currently in the action stage of change. As such, her goal is to continue with weight loss efforts. She has agreed to the Category 3 Plan.   Exercise goals: For substantial health benefits, adults should do at least 150 minutes (2 hours and 30 minutes) a week of moderate-intensity, or 75 minutes (1 hour and 15 minutes) a week of vigorous-intensity aerobic physical activity, or an equivalent combination of moderate- and vigorous-intensity aerobic activity. Aerobic activity should be performed in episodes of at least 10 minutes, and preferably, it should be spread throughout the week. Increase as tolerated.  Behavioral modification strategies: meal planning and cooking strategies, keeping healthy foods in the home, emotional eating strategies and planning for success.  Kendra Baird has agreed to follow-up with our clinic in 2 weeks. She was informed of the importance of frequent follow-up visits to maximize her success with intensive lifestyle modifications for her multiple health conditions.   Objective:   Blood pressure 104/69, pulse 84, temperature 97.7 F (36.5 C), height 5\' 6"  (1.676 m), weight 214 lb (97.1 kg), SpO2 100 %, currently breastfeeding. Body mass index is 34.54 kg/m.  General: Cooperative, alert, well developed, in no acute distress. HEENT: Conjunctivae and lids unremarkable. Cardiovascular: Regular rhythm.  Lungs: Normal work of breathing. Neurologic: No focal deficits.   Lab Results  Component Value Date   CREATININE 0.75 07/09/2020   BUN 13 07/09/2020   NA 141 07/09/2020   K 4.5 07/09/2020   CL 105 07/09/2020   CO2 24 07/09/2020   Lab Results  Component Value Date   ALT 12 07/09/2020   AST 12 07/09/2020   ALKPHOS 109 07/09/2020   BILITOT 0.4  07/09/2020   Lab Results  Component Value Date   HGBA1C 5.1 02/26/2020   Lab Results  Component Value Date   INSULIN 2.5 (L) 02/26/2020   Lab Results  Component Value Date   TSH 0.560 06/10/2020   Lab Results  Component Value Date   CHOL 166 02/26/2020   HDL 68 02/26/2020   LDLCALC 89 02/26/2020   TRIG 41 02/26/2020   Lab Results  Component Value Date   WBC 7.5 02/26/2020   HGB 14.4 02/26/2020   HCT 41.7 02/26/2020   MCV 88 02/26/2020   PLT 214 02/26/2020   Lab Results  Component Value Date   IRON 87 02/26/2020   TIBC 319 02/26/2020   FERRITIN 71 02/26/2020   Attestation Statements:   Reviewed by clinician on day of visit: allergies, medications, problem list, medical history, surgical history, family history, social history, and previous encounter notes.  I, 04/27/2020, CMA, am acting as Insurance claims handler for Energy manager, DO.  I have reviewed the above documentation for  accuracy and completeness, and I agree with the above. Carlye Grippe, D.O.  The 21st Century Cures Act was signed into law in 2016 which includes the topic of electronic health records.  This provides immediate access to information in MyChart.  This includes consultation notes, operative notes, office notes, lab results and pathology reports.  If you have any questions about what you read please let us know at your next visit so we can discuss your concerns and take corrective action if need be.  We are right here with you.

## 2020-11-09 DIAGNOSIS — J069 Acute upper respiratory infection, unspecified: Secondary | ICD-10-CM | POA: Diagnosis not present

## 2020-11-10 ENCOUNTER — Ambulatory Visit (INDEPENDENT_AMBULATORY_CARE_PROVIDER_SITE_OTHER): Payer: BC Managed Care – PPO | Admitting: Family Medicine

## 2020-11-10 ENCOUNTER — Encounter (INDEPENDENT_AMBULATORY_CARE_PROVIDER_SITE_OTHER): Payer: Self-pay | Admitting: Family Medicine

## 2020-11-10 ENCOUNTER — Other Ambulatory Visit: Payer: Self-pay

## 2020-11-10 VITALS — BP 120/74 | HR 74 | Temp 97.9°F | Ht 66.0 in | Wt 215.0 lb

## 2020-11-10 DIAGNOSIS — G43809 Other migraine, not intractable, without status migrainosus: Secondary | ICD-10-CM

## 2020-11-10 DIAGNOSIS — E669 Obesity, unspecified: Secondary | ICD-10-CM

## 2020-11-10 DIAGNOSIS — Z9189 Other specified personal risk factors, not elsewhere classified: Secondary | ICD-10-CM

## 2020-11-10 DIAGNOSIS — Z6834 Body mass index (BMI) 34.0-34.9, adult: Secondary | ICD-10-CM

## 2020-11-10 DIAGNOSIS — E559 Vitamin D deficiency, unspecified: Secondary | ICD-10-CM | POA: Diagnosis not present

## 2020-11-10 MED ORDER — TOPIRAMATE 50 MG PO TABS: ORAL_TABLET | ORAL | 0 refills | Status: DC

## 2020-11-10 MED ORDER — VITAMIN D (ERGOCALCIFEROL) 1.25 MG (50000 UNIT) PO CAPS: 50000.0000 [IU] | ORAL_CAPSULE | ORAL | 0 refills | Status: DC

## 2020-11-12 NOTE — Progress Notes (Signed)
Chief Complaint:   OBESITY Kendra Baird is here to discuss her progress with her obesity treatment plan along with follow-up of her obesity related diagnoses. Kendra Baird is on the Category 3 Plan and states she is following her eating plan approximately 85% of the time. Kendra Baird states she is walking/running, doing yoga for 30 minutes 2 times per week.  Today's visit was #: 15 Starting weight: 222 lbs Starting date: 02/26/2020 Today's weight: 215 lbs Today's date: 11/10/2020 Total lbs lost to date: 7 lbs Total lbs lost since last in-office visit: +1 lb Total weight loss percentage to date: -3.15%  Interim History: Kendra Baird says her hormones are all over the place lately.  Her cravings are all over the place as well.  She says she has increased sweets cravings/eating more candy, but she feels like this is temporary and related to her menses.  Declines need for change in treatment plan.  Assessment/Plan:   1. Other migraine without status migrainosus, not intractable Cravings have decreased with increased Topamax dose.  She says she is less tired.  -Refill topiramate (TOPAMAX) 50 MG tablet; 25mg  qd plus 50mg  q hs  Dispense: 45 tablet; Refill: 0  2. Vitamin D deficiency Kendra Baird's Vitamin D level was 19.8 on 07/09/2020. She is currently taking prescription vitamin D 50,000 IU each week. She denies nausea, vomiting or muscle weakness.  -Refill Vitamin D, Ergocalciferol, (DRISDOL) 1.25 MG (50000 UNIT) CAPS capsule; Take 1 capsule (50,000 Units total) by mouth every 7 (seven) days.  Dispense: 4 capsule; Refill: 0  3. At risk for dehydration Kendra Baird is at higher than average risk of dehydration.  Kendra Baird was given more than 8.5 minutes of proper hydration counseling today.  We discussed the signs and symptoms of dehydration, some of which may include muscle cramping, constipation, or even orthostatic symptoms.  Counseling on the prevention of dehydration was also provided today.  Kendra Baird is at risk for  dehydration due to weight loss, lifestyle and behavorial habits, and possibly due to taking certain medication(s).  She was encouraged to adequately hydrate and monitor fluid status to avoid dehydration as well as weight loss plateaus.  Unless pre-existing renal or cardiopulmonary conditions exist, which pt was told to limit their fluid intake.  I recommended roughly one half of their weight in pounds to be the approximate ounces of non-caloric, non-caffeinated beverages they should drink per day; including more if they are engaging in exercise.  4. Class 1 obesity with serious comorbidity and body mass index (BMI) of 34.0 to 34.9 in adult, unspecified obesity type  Kendra Baird is currently in the action stage of change. As such, her goal is to continue with weight loss efforts. She has agreed to the Category 3 Plan.   Exercise goals: For substantial health benefits, adults should do at least 150 minutes (2 hours and 30 minutes) a week of moderate-intensity, or 75 minutes (1 hour and 15 minutes) a week of vigorous-intensity aerobic physical activity, or an equivalent combination of moderate- and vigorous-intensity aerobic activity. Aerobic activity should be performed in episodes of at least 10 minutes, and preferably, it should be spread throughout the week.  Behavioral modification strategies: better snacking choices, emotional eating strategies, holiday eating strategies , celebration eating strategies and planning for success.  Kendra Baird has agreed to follow-up with our clinic in 2-3 weeks. She was informed of the importance of frequent follow-up visits to maximize her success with intensive lifestyle modifications for her multiple health conditions.   Objective:   Blood  pressure 120/74, pulse 74, temperature 97.9 F (36.6 C), height 5\' 6"  (1.676 m), weight 215 lb (97.5 kg), SpO2 99 %, currently breastfeeding. Body mass index is 34.7 kg/m.  General: Cooperative, alert, well developed, in no acute  distress. HEENT: Conjunctivae and lids unremarkable. Cardiovascular: Regular rhythm.  Lungs: Normal work of breathing. Neurologic: No focal deficits.   Lab Results  Component Value Date   CREATININE 0.75 07/09/2020   BUN 13 07/09/2020   NA 141 07/09/2020   K 4.5 07/09/2020   CL 105 07/09/2020   CO2 24 07/09/2020   Lab Results  Component Value Date   ALT 12 07/09/2020   AST 12 07/09/2020   ALKPHOS 109 07/09/2020   BILITOT 0.4 07/09/2020   Lab Results  Component Value Date   HGBA1C 5.1 02/26/2020   Lab Results  Component Value Date   INSULIN 2.5 (L) 02/26/2020   Lab Results  Component Value Date   TSH 0.560 06/10/2020   Lab Results  Component Value Date   CHOL 166 02/26/2020   HDL 68 02/26/2020   LDLCALC 89 02/26/2020   TRIG 41 02/26/2020   Lab Results  Component Value Date   WBC 7.5 02/26/2020   HGB 14.4 02/26/2020   HCT 41.7 02/26/2020   MCV 88 02/26/2020   PLT 214 02/26/2020   Lab Results  Component Value Date   IRON 87 02/26/2020   TIBC 319 02/26/2020   FERRITIN 71 02/26/2020   Attestation Statements:   Reviewed by clinician on day of visit: allergies, medications, problem list, medical history, surgical history, family history, social history, and previous encounter notes.  I, 04/27/2020, CMA, am acting as Insurance claims handler for Energy manager, DO.  I have reviewed the above documentation for accuracy and completeness, and I agree with the above. [employer], D.O.  The 21st Century Cures Act was signed into law in 2016 which includes the topic of electronic health records.  This provides immediate access to information in MyChart.  This includes consultation notes, operative notes, office notes, lab results and pathology reports.  If you have any questions about what you read please let 2017 know at your next visit so we can discuss your concerns and take corrective action if need be.  We are right here with you.

## 2020-12-01 ENCOUNTER — Ambulatory Visit (INDEPENDENT_AMBULATORY_CARE_PROVIDER_SITE_OTHER): Payer: BC Managed Care – PPO | Admitting: Family Medicine

## 2020-12-01 ENCOUNTER — Other Ambulatory Visit: Payer: Self-pay

## 2020-12-01 ENCOUNTER — Encounter (INDEPENDENT_AMBULATORY_CARE_PROVIDER_SITE_OTHER): Payer: Self-pay | Admitting: Family Medicine

## 2020-12-01 VITALS — BP 98/57 | HR 72 | Temp 97.9°F | Ht 66.0 in | Wt 216.0 lb

## 2020-12-01 DIAGNOSIS — E669 Obesity, unspecified: Secondary | ICD-10-CM

## 2020-12-01 DIAGNOSIS — G43809 Other migraine, not intractable, without status migrainosus: Secondary | ICD-10-CM

## 2020-12-01 DIAGNOSIS — E559 Vitamin D deficiency, unspecified: Secondary | ICD-10-CM

## 2020-12-01 DIAGNOSIS — Z9189 Other specified personal risk factors, not elsewhere classified: Secondary | ICD-10-CM | POA: Diagnosis not present

## 2020-12-01 DIAGNOSIS — Z6834 Body mass index (BMI) 34.0-34.9, adult: Secondary | ICD-10-CM

## 2020-12-01 DIAGNOSIS — N926 Irregular menstruation, unspecified: Secondary | ICD-10-CM | POA: Diagnosis not present

## 2020-12-01 MED ORDER — VITAMIN D (ERGOCALCIFEROL) 1.25 MG (50000 UNIT) PO CAPS: 50000.0000 [IU] | ORAL_CAPSULE | ORAL | 0 refills | Status: DC

## 2020-12-01 MED ORDER — TOPIRAMATE 50 MG PO TABS: ORAL_TABLET | ORAL | 0 refills | Status: DC

## 2020-12-01 NOTE — Progress Notes (Signed)
NEUROLOGY FOLLOW UP OFFICE NOTE  Kendra Baird [id number]   Subjective:  Kendra Baird a 29 year oldright-handed female whofollows up for migraines.  UPDATE: No longer breasfeeding.  She doesn't like the way sumatriptan makes her feel (like a "zombie" and agitated afterwards) Intensity:  Moderate (rarely severe) Duration:  45 minutes with sumatriptan Frequency:  Once a month to once every other month  Current NSAIDS:ibuprofen Current analgesics:Tylenol Current triptans:sumatriptan 100mg  Current ergotamine:None Current anti-emetic:None Current muscle relaxants:None Current anti-anxiolytic:None Current sleep aide:None Current Antihypertensive medications:None Current Antidepressant medications:None Current Anticonvulsant medications:topiramate 50mg  twice daily (taking for weight loss) Current anti-CGRP:None Current Vitamins/Herbal/Supplements:magnesium gluconate 500mg  twice daily Current Antihistamines/Decongestants:None Other therapy:None  Caffeine:Rarely drinks coffee Diet:Drinks 1 gallon of water daily. Drinks little soda. Exercise:Runs, light free weights Depression:no; Anxiety:no Other pain:no Sleep hygiene:good  HISTORY: Onset:She has had migraines sincehighschool. She reports a few concussions in highschool basketball. Severe in college and then improved.  Location:Right peri-orbital and then radiates over the right side of her head to the shoulder. Quality:Pressure/non-throbbing Initial intensity:4-5/10 (8.5/10 debilitating).Shedenies new headache, thunderclap headache Aura:No Prodrome:No Postdrome:No Associated symptoms:Loss of appetite, sometimesnausea, photophobia or phonophobia.Shedenies associated unilateral numbness or weakness. InitialDuration:All day InitialFrequency:Usually every other month (debilitating one every 4 months) InitialFrequency of abortive  medication:every other month Triggers:Certain prenatalvitamins, stress, dehydration, menstrual cycle Relieving factors:Sleep, caffeine, hydration Activity:No  Headaches improved during her first pregnancy in 2017. They began to become more frequent about 1 1/2 years after she gave birth to her first son.   On 10/9/2019she had another typical debilitating migraine. However, she had passed out, which was different. She went to the EDfor evaluation and treatment of her headache. She received a headache cocktail of Reglan and Benadryl. CT of the head without contrast was personally reviewed and was unremarkable. She hasn't had a migraine since then.   Past NSAIDS:Ibuprofen, naproxen Past analgesics:Tylenol, Excedrin Past abortive triptans:Maybe sumatriptan Past abortive ergotamine:None Past muscle relaxants:None Past anti-emetic:None Past antihypertensive medications:None Past antidepressant medications:None Past anticonvulsant medications:None Past anti-CGRP:None Past vitamins/Herbal/Supplements:None Past antihistamines/decongestants:None Other past therapies:None  Family history of headache:no  PAST MEDICAL HISTORY: Past Medical History:  Diagnosis Date  . Anxiety   . Back pain   . Depression   . Fatigue   . Headache   . Hx of varicella   . Joint pain   . Kidney problem   . Migraine     MEDICATIONS: Current Outpatient Medications on File Prior to Visit  Medication Sig Dispense Refill  . Collagen-Vitamin C (COLLAGEN PLUS VITAMIN C PO) Take 1 tablet by mouth 2 (two) times daily.    . INCASSIA 0.35 MG tablet TAKE 1 TABLET BY MOUTH EVERY DAY **TAKE CONTINUOUSLY    . Magnesium 400 MG TABS Take 1 tablet by mouth daily.    . Prenatal Vit-Fe Fumarate-FA (PRENATAL MULTIVITAMIN) TABS tablet Take 1 tablet by mouth daily at 12 noon.    . SUMAtriptan (IMITREX) 100 MG tablet Take 1 tablet earliest onset of migraine.  May repeat in 2  hours if headache persists or recurs.  Maximum 2 tablets in 24 hours.  Hold breastfeeding for at least 8 to 12 hours after use. 10 tablet 5  . topiramate (TOPAMAX) 50 MG tablet 25mg  qd plus 50mg  q hs 45 tablet 0  . Vitamin D, Ergocalciferol, (DRISDOL) 1.25 MG (50000 UNIT) CAPS capsule Take 1 capsule (50,000 Units total) by mouth every 7 (seven) days. 4 capsule 0   No current facility-administered medications on file prior to visit.    ALLERGIES: No Known  Allergies  FAMILY HISTORY: Family History  Problem Relation Age of Onset  . Varicose Veins Mother   . Cancer Mother   . Obesity Mother   . Diabetes Father   . Cancer Father 30  . Hypertension Father   . Heart attack Father   . CVA Father   . Renal Disease Father   . Obesity Father   . Varicose Veins Brother   . Heart disease Paternal Grandfather     SOCIAL HISTORY: Social History   Socioeconomic History  . Marital status: Married    Spouse name: Scottie Stanish  . Number of children: 2  . Years of education: Not on file  . Highest education level: Bachelor's degree (e.g., BA, AB, BS)  Occupational History  . Occupation: Reservations    CommentLexicographer  Tobacco Use  . Smoking status: Never Smoker  . Smokeless tobacco: Never Used  Vaping Use  . Vaping Use: Never used  Substance and Sexual Activity  . Alcohol use: No  . Drug use: No  . Sexual activity: Not Currently  Other Topics Concern  . Not on file  Social History Narrative   Patient is right-handed. She lives with her husband and son in a one level home, with a few steps to enter from garage. She rarely drinks caffeine - maybe 1 cup coffee a day. She does not exercise.      Baby born Aug 08, 2021   Social Determinants of Health   Financial Resource Strain:   . Difficulty of Paying Living Expenses: Not on file  Food Insecurity:   . Worried About Programme researcher, broadcasting/film/video in the Last Year: Not on file  . Ran Out of Food in the Last Year: Not on file    Transportation Needs:   . Lack of Transportation (Medical): Not on file  . Lack of Transportation (Non-Medical): Not on file  Physical Activity:   . Days of Exercise per Week: Not on file  . Minutes of Exercise per Session: Not on file  Stress:   . Feeling of Stress : Not on file  Social Connections:   . Frequency of Communication with Friends and Family: Not on file  . Frequency of Social Gatherings with Friends and Family: Not on file  . Attends Religious Services: Not on file  . Active Member of Clubs or Organizations: Not on file  . Attends Banker Meetings: Not on file  . Marital Status: Not on file  Intimate Partner Violence:   . Fear of Current or Ex-Partner: Not on file  . Emotionally Abused: Not on file  . Physically Abused: Not on file  . Sexually Abused: Not on file     Objective:  Blood pressure 124/78, pulse 74, resp. rate 20, height 5\' 7"  (1.702 m), weight 222 lb (100.7 kg), SpO2 100 %, currently breastfeeding. General: No acute distress.  Patient appears well-groomed.   Head:  Normocephalic/atraumatic Eyes:  Fundi examined but not visualized Neck: supple, no paraspinal tenderness, full range of motion Heart:  Regular rate and rhythm Lungs:  Clear to auscultation bilaterally Back: No paraspinal tenderness Neurological Exam: alert and oriented to person, place, and time. Attention span and concentration intact, recent and remote memory intact, fund of knowledge intact.  Speech fluent and not dysarthric, language intact.  CN II-XII intact. Bulk and tone normal, muscle strength 5/5 throughout.  Sensation to light touch, temperature and vibration intact.  Deep tendon reflexes 2+ throughout, toes downgoing.  Finger to  nose and heel to shin testing intact.  Gait normal, Romberg negative.   Assessment/Plan:   Migraine without aura, without status migrainosus, not intractable.  1.  Preventative not prescribed as infrequent.  However, she is prescribed  topiramate by another physician to help with weight loss. 2.  For rescue, she will try Ubrelvy 100mg .  If effective, she will contact for prescription.  Stop sumatriptan. 3.  Limit use of pain relievers to no more than 2 days out of week to prevent risk of rebound or medication-overuse headache. 4.  Keep headache diary 5.  Follow up 9 months.  [place], DO  CC: Shon Millet, NP

## 2020-12-01 NOTE — Progress Notes (Signed)
Chief Complaint:   OBESITY Kendra Baird is here to discuss her progress with her obesity treatment plan along with follow-up of her obesity related diagnoses. Kendra Baird is on the Category 3 Plan and states she is following her eating plan approximately 80% of the time. Kendra Baird states she is running 30-35 minutes 3 times per week.  Today's visit was #: 16 Starting weight: 222 lbs Starting date: 02/26/2020 Today's weight: 216 lbs Today's date: 12/01/2020 Total lbs lost to date: 6 lbs Total lbs lost since last in-office visit: +1 lbs Total weight loss percentage to date: -2.70%  Interim History: Kendra Baird has increased emotional eating and cravings the past 1-2 weeks, especially over the holidays. She changed/modified her Topamax and is taking 50 mg every morning and 50 mg every night. She is tolerating it well in the mornings without drowsiness. She reports that her menstrual cycle is all over the place- getting her periods every 2-3 weeks. She is working with her GYN closely on this.   Plan: Increase Topamax to 50 mg BID, which helps with patient's emotional eating.  Assessment/Plan:   1. Irregular menses Kendra Baird reports that her menstrual cycle is all over the place- getting her periods every 2-3 weeks. She is working with her GYN closely on this.   Plan: Continue to work with her GYN, Dr. Noland Fordyce, regarding irregular menses.  2. Vitamin D deficiency Kendra Baird's Vitamin D level was 19.8 on 07/09/2020. She is currently taking prescription vitamin D 50,000 IU each week. She denies nausea, vomiting or muscle weakness.  Plan: Refill Vit D for 1 month, as per below. Recheck Vit D at next OV (pt declines labs today).  Low Vitamin D level contributes to fatigue and are associated with obesity, breast, and colon cancer. She agrees to continue to take prescription Vitamin D @50 ,000 IU every week and will follow-up for routine testing of Vitamin D, at least 2-3 times per year to avoid  over-replacement. - Vitamin D, Ergocalciferol, (DRISDOL) 1.25 MG (50000 UNIT) CAPS capsule; Take 1 capsule (50,000 Units total) by mouth every 7 (seven) days.  Dispense: 4 capsule; Refill: 0  3. Other migraine without status migrainosus, not intractable [name] reports having had 1 migraine just prior to the start of her menses a couple of days ago. She also had increased stress and not as much water intake as usual lately.  Plan: Increase Topamax to 50 mg BID. Rx sent to pharmacy with ne instructions.  Increased Rx- topiramate (TOPAMAX) 50 MG tablet; 50mg  qd plus 50mg  q hs  Dispense: 60 tablet; Refill: 0  4. At risk for depression Kendra Baird was given approximately 15 minutes of depression risk counseling today. She has risk factors for depression. We discussed the importance of a healthy work life balance, a healthy relationship with food and a good support system.  5. Class 1 obesity with serious comorbidity and body mass index (BMI) of 34.0 to 34.9 in adult, unspecified obesity type [name] is currently in the action stage of change. As such, her goal is to continue with weight loss efforts. She has agreed to the Category 3 Plan.   Exercise goals: For substantial health benefits, adults should do at least 150 minutes (2 hours and 30 minutes) a week of moderate-intensity, or 75 minutes (1 hour and 15 minutes) a week of vigorous-intensity aerobic physical activity, or an equivalent combination of moderate- and vigorous-intensity aerobic activity. Aerobic activity should be performed in episodes of at least 10 minutes, and preferably, it should  be spread throughout the week.  Behavioral modification strategies: increasing water intake, ways to avoid boredom eating, emotional eating strategies and planning for success.  Kendra Baird has agreed to follow-up with our clinic in 2-4 weeks (next OV, check Vit D level). She was informed of the importance of frequent follow-up visits to maximize her success with  intensive lifestyle modifications for her multiple health conditions.   Objective:   Blood pressure (!) 98/57, pulse 72, temperature 97.9 F (36.6 C), height 5\' 6"  (1.676 m), weight 216 lb (98 kg), SpO2 99 %, currently breastfeeding. Body mass index is 34.86 kg/m.  General: Cooperative, alert, well developed, in no acute distress. HEENT: Conjunctivae and lids unremarkable. Cardiovascular: Regular rhythm.  Lungs: Normal work of breathing. Neurologic: No focal deficits.   Lab Results  Component Value Date   CREATININE 0.75 07/09/2020   BUN 13 07/09/2020   NA 141 07/09/2020   K 4.5 07/09/2020   CL 105 07/09/2020   CO2 24 07/09/2020   Lab Results  Component Value Date   ALT 12 07/09/2020   AST 12 07/09/2020   ALKPHOS 109 07/09/2020   BILITOT 0.4 07/09/2020   Lab Results  Component Value Date   HGBA1C 5.1 02/26/2020   Lab Results  Component Value Date   INSULIN 2.5 (L) 02/26/2020   Lab Results  Component Value Date   TSH 0.560 06/10/2020   Lab Results  Component Value Date   CHOL 166 02/26/2020   HDL 68 02/26/2020   LDLCALC 89 02/26/2020   TRIG 41 02/26/2020   Lab Results  Component Value Date   WBC 7.5 02/26/2020   HGB 14.4 02/26/2020   HCT 41.7 02/26/2020   MCV 88 02/26/2020   PLT 214 02/26/2020   Lab Results  Component Value Date   IRON 87 02/26/2020   TIBC 319 02/26/2020   FERRITIN 71 02/26/2020    Attestation Statements:   Reviewed by clinician on day of visit: allergies, medications, problem list, medical history, surgical history, family history, social history, and previous encounter notes.  04/27/2020, am acting as Edmund Hilda for Energy manager, DO.  I have reviewed the above documentation for accuracy and completeness, and I agree with the above. [employer], D.O.  The 21st Century Cures Act was signed into law in 2016 which includes the topic of electronic health records.  This provides immediate access to  information in MyChart.  This includes consultation notes, operative notes, office notes, lab results and pathology reports.  If you have any questions about what you read please let 2017 know at your next visit so we can discuss your concerns and take corrective action if need be.  We are right here with you.

## 2020-12-02 ENCOUNTER — Encounter: Payer: Self-pay | Admitting: Neurology

## 2020-12-02 ENCOUNTER — Ambulatory Visit (INDEPENDENT_AMBULATORY_CARE_PROVIDER_SITE_OTHER): Payer: BC Managed Care – PPO | Admitting: Neurology

## 2020-12-02 VITALS — BP 124/78 | HR 74 | Resp 20 | Ht 67.0 in | Wt 222.0 lb

## 2020-12-02 DIAGNOSIS — G43009 Migraine without aura, not intractable, without status migrainosus: Secondary | ICD-10-CM | POA: Diagnosis not present

## 2020-12-02 NOTE — Patient Instructions (Signed)
  1. Take Ubrelvy 100mg  at earliest onset of headache.  May repeat dose once in 2 hours if needed.  Maximum 2 tablets in 24 hours.  If effective, contact me for prescription.  Stop sumatriptan. 2. Limit use of pain relievers to no more than 2 days out of the week.  These medications include acetaminophen, NSAIDs (ibuprofen/Advil/Motrin, naproxen/Aleve, triptans (Imitrex/sumatriptan), Excedrin, and narcotics.  This will help reduce risk of rebound headaches. 3. Be aware of common food triggers:  - Caffeine:  coffee, black tea, cola, Mt. Dew  - Chocolate  - Dairy:  aged cheeses (brie, blue, cheddar, gouda, [place], provolone, [place], Swiss, etc), chocolate milk, buttermilk, sour cream, limit eggs and yogurt  - Nuts, peanut butter  - Alcohol  - Cereals/grains:  FRESH breads (fresh bagels, sourdough, doughnuts), yeast productions  - Processed/canned/aged/cured meats (pre-packaged deli meats, hotdogs)  - MSG/glutamate:  soy sauce, flavor enhancer, pickled/preserved/marinated foods  - Sweeteners:  aspartame (Equal, Nutrasweet).  Sugar and Splenda are okay  - Vegetables:  legumes (lima beans, lentils, snow peas, fava beans, pinto peans, peas, garbanzo beans), sauerkraut, onions, olives, pickles  - Fruit:  avocados, bananas, citrus fruit (orange, lemon, grapefruit), mango  - Other:  Frozen meals, macaroni and cheese 4. Routine exercise 5. Stay adequately hydrated (aim for 64 oz water daily) 6. Keep headache diary 7. Maintain proper stress management 8. Maintain proper sleep hygiene 9. Do not skip meals 10. Consider supplements:  magnesium citrate 400mg  daily, riboflavin 400mg  daily, coenzyme Q10 100mg  three times daily.

## 2020-12-16 ENCOUNTER — Other Ambulatory Visit: Payer: Self-pay

## 2020-12-16 ENCOUNTER — Encounter (INDEPENDENT_AMBULATORY_CARE_PROVIDER_SITE_OTHER): Payer: Self-pay | Admitting: Family Medicine

## 2020-12-16 ENCOUNTER — Ambulatory Visit (INDEPENDENT_AMBULATORY_CARE_PROVIDER_SITE_OTHER): Payer: BC Managed Care – PPO | Admitting: Family Medicine

## 2020-12-16 VITALS — BP 103/71 | HR 75 | Temp 98.4°F | Ht 66.0 in | Wt 213.0 lb

## 2020-12-16 DIAGNOSIS — G43809 Other migraine, not intractable, without status migrainosus: Secondary | ICD-10-CM

## 2020-12-16 DIAGNOSIS — E559 Vitamin D deficiency, unspecified: Secondary | ICD-10-CM | POA: Diagnosis not present

## 2020-12-16 DIAGNOSIS — Z6834 Body mass index (BMI) 34.0-34.9, adult: Secondary | ICD-10-CM | POA: Diagnosis not present

## 2020-12-16 DIAGNOSIS — E669 Obesity, unspecified: Secondary | ICD-10-CM

## 2020-12-16 DIAGNOSIS — Z9189 Other specified personal risk factors, not elsewhere classified: Secondary | ICD-10-CM

## 2020-12-16 MED ORDER — VITAMIN D (ERGOCALCIFEROL) 1.25 MG (50000 UNIT) PO CAPS: 50000.0000 [IU] | ORAL_CAPSULE | ORAL | 0 refills | Status: DC

## 2020-12-17 LAB — VITAMIN D 25 HYDROXY (VIT D DEFICIENCY, FRACTURES): Vit D, 25-Hydroxy: 67.7 ng/mL (ref 30.0–100.0)

## 2020-12-17 NOTE — Progress Notes (Signed)
Chief Complaint:   OBESITY Kendra Baird is here to discuss her progress with her obesity treatment plan along with follow-up of her obesity related diagnoses. Kendra Baird is on the Category 3 Plan and states she is following her eating plan approximately 90% of the time. Kendra Baird states she is walking and using treadmill 30-45 minutes 3-4 times per week.  Today's visit was #: 17 Starting weight: 222 lbs Starting date: 02/26/2020 Today's weight: 213 lbs Today's date: 12/16/2020 Total lbs lost to date: 9 lbs Total lbs lost since last in-office visit: 3 lbs Total weight loss percentage to date: -4.05%  Interim History: Kendra Baird is focused on sticking with the plan and working out.  Assessment/Plan:   1. Other migraine without status migrainosus, not intractable Kendra Baird is prescribed Topamax, which we increased the dose at her last OV. She is tolerating it well and notes that it has decreased cravings and causes her to be more controlled with eating habits.  Plan: Continue current treatment plan, with twice a day dose. Kendra Baird doesn't report a need for refill today.  2. Vitamin D deficiency Kendra Baird's Vitamin D level was 19.8 on 07/09/2020. She is currently taking prescription vitamin D 50,000 IU each week. She denies nausea, vomiting or muscle weakness.   Ref. Range 07/09/2020 15:18  Vitamin D, 25-Hydroxy Latest Ref Range: 30.0 - 100.0 ng/mL 19.8 (L)   Plan: Refill Vit D for 1 month, as per below. Recheck Vit D level today. Low Vitamin D level contributes to fatigue and are associated with obesity, breast, and colon cancer. She agrees to continue to take prescription Vitamin D @50 ,000 IU every week and will follow-up for routine testing of Vitamin D, at least 2-3 times per year to avoid over-replacement.  Refill- Vitamin D, Ergocalciferol, (DRISDOL) 1.25 MG (50000 UNIT) CAPS capsule; Take 1 capsule (50,000 Units total) by mouth every 7 (seven) days.  Dispense: 4 capsule; Refill: 0  Orders - VITAMIN D  25 Hydroxy (Vit-D Deficiency, Fractures)  3. At risk for dehydration Kendra Baird is at higher than average risk of dehydration. Kendra Baird was given more than 10 minutes of proper hydration counseling today. We discussed the signs and symptoms of dehydration some of which may include muscle cramping, constipation or even orthostatic symptoms. Counseling on the prevention of dehydration was also provided today. Kendra Baird is at risk for dehydration due to weight loss, lifestyle and behavorial habits and possibly due to taking certain medication(s). She was encouraged to adequately hydrate and monitor fluid status to avoid dehydration as well as weight loss plateaus. Unless pre-existing renal or cardiopulmonary conditions exist which pt was told to limit their fluid intake, I recommended roughly one half of their weight in pounds to be the approximate ounces of non-caloric, non-caffeinated beverages they should drink per day; including more if they are engaging in exercise.  4. Class 1 obesity with serious comorbidity and body mass index (BMI) of 34.0 to 34.9 in adult, unspecified obesity type Kendra Baird is currently in the action stage of change. As such, her goal is to continue with weight loss efforts. She has agreed to the Category 3 Plan.   Exercise goals: As is  Behavioral modification strategies: increasing water intake, holiday eating strategies  and planning for success.  Kendra Baird has agreed to follow-up with our clinic in 2-3 weeks. She was informed of the importance of frequent follow-up visits to maximize her success with intensive lifestyle modifications for her multiple health conditions.   Objective:   Blood pressure 103/71, pulse  75, temperature 98.4 F (36.9 C), height 5\' 6"  (1.676 m), weight 213 lb (96.6 kg), SpO2 98 %, currently breastfeeding. Body mass index is 34.38 kg/m.  General: Cooperative, alert, well developed, in no acute distress. HEENT: Conjunctivae and lids  unremarkable. Cardiovascular: Regular rhythm.  Lungs: Normal work of breathing. Neurologic: No focal deficits.   Lab Results  Component Value Date   CREATININE 0.75 07/09/2020   BUN 13 07/09/2020   NA 141 07/09/2020   K 4.5 07/09/2020   CL 105 07/09/2020   CO2 24 07/09/2020   Lab Results  Component Value Date   ALT 12 07/09/2020   AST 12 07/09/2020   ALKPHOS 109 07/09/2020   BILITOT 0.4 07/09/2020   Lab Results  Component Value Date   HGBA1C 5.1 02/26/2020   Lab Results  Component Value Date   INSULIN 2.5 (L) 02/26/2020   Lab Results  Component Value Date   TSH 0.560 06/10/2020   Lab Results  Component Value Date   CHOL 166 02/26/2020   HDL 68 02/26/2020   LDLCALC 89 02/26/2020   TRIG 41 02/26/2020   Lab Results  Component Value Date   WBC 7.5 02/26/2020   HGB 14.4 02/26/2020   HCT 41.7 02/26/2020   MCV 88 02/26/2020   PLT 214 02/26/2020   Lab Results  Component Value Date   IRON 87 02/26/2020   TIBC 319 02/26/2020   FERRITIN 71 02/26/2020    Attestation Statements:   Reviewed by clinician on day of visit: allergies, medications, problem list, medical history, surgical history, family history, social history, and previous encounter notes.  04/27/2020, am acting as Edmund Hilda for Energy manager, DO.  I have reviewed the above documentation for accuracy and completeness, and I agree with the above. [employer], D.O.  The 21st Century Cures Act was signed into law in 2016 which includes the topic of electronic health records.  This provides immediate access to information in MyChart.  This includes consultation notes, operative notes, office notes, lab results and pathology reports.  If you have any questions about what you read please let 2017 know at your next visit so we can discuss your concerns and take corrective action if need be.  We are right here with you.

## 2020-12-29 ENCOUNTER — Ambulatory Visit (INDEPENDENT_AMBULATORY_CARE_PROVIDER_SITE_OTHER): Payer: BC Managed Care – PPO | Admitting: Family Medicine

## 2020-12-29 ENCOUNTER — Other Ambulatory Visit: Payer: Self-pay

## 2020-12-29 ENCOUNTER — Encounter (INDEPENDENT_AMBULATORY_CARE_PROVIDER_SITE_OTHER): Payer: Self-pay | Admitting: Family Medicine

## 2020-12-29 VITALS — BP 110/72 | HR 69 | Temp 98.0°F | Ht 66.0 in | Wt 214.0 lb

## 2020-12-29 DIAGNOSIS — Z9189 Other specified personal risk factors, not elsewhere classified: Secondary | ICD-10-CM

## 2020-12-29 DIAGNOSIS — Z6834 Body mass index (BMI) 34.0-34.9, adult: Secondary | ICD-10-CM

## 2020-12-29 DIAGNOSIS — G43809 Other migraine, not intractable, without status migrainosus: Secondary | ICD-10-CM

## 2020-12-29 DIAGNOSIS — E669 Obesity, unspecified: Secondary | ICD-10-CM

## 2020-12-29 DIAGNOSIS — E559 Vitamin D deficiency, unspecified: Secondary | ICD-10-CM | POA: Diagnosis not present

## 2020-12-29 MED ORDER — VITAMIN D (ERGOCALCIFEROL) 1.25 MG (50000 UNIT) PO CAPS: 50000.0000 [IU] | ORAL_CAPSULE | ORAL | 0 refills | Status: DC

## 2020-12-29 MED ORDER — TOPIRAMATE 50 MG PO TABS: ORAL_TABLET | ORAL | 0 refills | Status: DC

## 2020-12-29 NOTE — Progress Notes (Signed)
Chief Complaint:   OBESITY Kendra Baird is here to discuss her progress with her obesity treatment plan along with follow-up of her obesity related diagnoses. Kendra Baird is on the Category 3 Plan and states she is following her eating plan approximately 90% of the time. Kendra Baird states she is doing interval training and walking 60 minutes 5 times per week.  Today's visit was #: 18 Starting weight: 222 lbs Starting date: 02/26/2020 Today's weight: 214 lbs Today's date: 12/29/2020 Total lbs lost to date: 8 lbs Total lbs lost since last in-office visit: +1 lb Total weight loss percentage to date: -3.60%  Interim History: Kendra Baird reports that she continued to workout through the holidays and went on daily walks. She did the [place] plan during the holiday meals and practiced mindful eating.   Assessment/Plan:   1. Vitamin D deficiency Discussed labs with patient today. Kendra Baird's Vitamin D level is within normal limits now at 67.7 on 12/16/2020, increased from 19.8 on 07/09/2020. She is currently taking prescription vitamin D 50,000 IU each week. She denies nausea, vomiting or muscle weakness.   Ref. Range 12/16/2020 14:32  Vitamin D, 25-Hydroxy Latest Ref Range: 30.0 - 100.0 ng/mL 67.7   Plan: Low Vitamin D level contributes to fatigue and are associated with obesity, breast, and colon cancer. She agrees to continue to take prescription Vitamin D @50 ,000 IU every week and will follow-up for routine testing of Vitamin D, at least 2-3 times per year to avoid over-replacement. Refill Vit D for 1 month, as per below.  Refill- Vitamin D, Ergocalciferol, (DRISDOL) 1.25 MG (50000 UNIT) CAPS capsule; Take 1 capsule (50,000 Units total) by mouth every 7 (seven) days.  Dispense: 4 capsule; Refill: 0  2. Other migraine without status migrainosus, not intractable Kendra Baird is prescribed Topamax. She is tolerating it well and notes that it has decreased her cravings and causes her to be more controlled with eating  habits.  Plan: Continue current treatment plan. Refill Topamax for 1 month, as per below.  Refill- topiramate (TOPAMAX) 50 MG tablet; 50mg  qd plus 50mg  q hs  Dispense: 60 tablet; Refill: 0  3. At risk for impaired metabolic function Kendra Baird was given approximately 15 minutes of impaired  metabolic function prevention counseling today. We discussed intensive lifestyle modifications today with an emphasis on specific nutrition and exercise instructions and strategies.   4. Class 1 obesity with serious comorbidity and body mass index (BMI) of 34.0 to 34.9 in adult, unspecified obesity type Kendra Baird is currently in the action stage of change. As such, her goal is to continue with weight loss efforts. She has agreed to the Category 3 Plan.   Exercise goals: As is  Behavioral modification strategies: meal planning and cooking strategies, avoiding temptations and planning for success.  Kendra Baird has agreed to follow-up with our clinic in 2 weeks. She was informed of the importance of frequent follow-up visits to maximize her success with intensive lifestyle modifications for her multiple health conditions.   Objective:   Blood pressure 110/72, pulse 69, temperature 98 F (36.7 C), height 5\' 6"  (1.676 m), weight 214 lb (97.1 kg), SpO2 99 %, currently breastfeeding. Body mass index is 34.54 kg/m.  General: Cooperative, alert, well developed, in no acute distress. HEENT: Conjunctivae and lids unremarkable. Cardiovascular: Regular rhythm.  Lungs: Normal work of breathing. Neurologic: No focal deficits.   Lab Results  Component Value Date   CREATININE 0.75 07/09/2020   BUN 13 07/09/2020   NA 141 07/09/2020   K  4.5 07/09/2020   CL 105 07/09/2020   CO2 24 07/09/2020   Lab Results  Component Value Date   ALT 12 07/09/2020   AST 12 07/09/2020   ALKPHOS 109 07/09/2020   BILITOT 0.4 07/09/2020   Lab Results  Component Value Date   HGBA1C 5.1 02/26/2020   Lab Results  Component Value Date    INSULIN 2.5 (L) 02/26/2020   Lab Results  Component Value Date   TSH 0.560 06/10/2020   Lab Results  Component Value Date   CHOL 166 02/26/2020   HDL 68 02/26/2020   LDLCALC 89 02/26/2020   TRIG 41 02/26/2020   Lab Results  Component Value Date   WBC 7.5 02/26/2020   HGB 14.4 02/26/2020   HCT 41.7 02/26/2020   MCV 88 02/26/2020   PLT 214 02/26/2020   Lab Results  Component Value Date   IRON 87 02/26/2020   TIBC 319 02/26/2020   FERRITIN 71 02/26/2020    Attestation Statements:   Reviewed by clinician on day of visit: allergies, medications, problem list, medical history, surgical history, family history, social history, and previous encounter notes.  Edmund Hilda, am acting as Energy manager for [employer], DO.  I have reviewed the above documentation for accuracy and completeness, and I agree with the above. Carlye Grippe, D.O.  The 21st Century Cures Act was signed into law in 2016 which includes the topic of electronic health records.  This provides immediate access to information in MyChart.  This includes consultation notes, operative notes, office notes, lab results and pathology reports.  If you have any questions about what you read please let us know at your next visit so we can discuss your concerns and take corrective action if need be.  We are right here with you.

## 2021-01-07 ENCOUNTER — Encounter (INDEPENDENT_AMBULATORY_CARE_PROVIDER_SITE_OTHER): Payer: Self-pay

## 2021-01-12 ENCOUNTER — Other Ambulatory Visit: Payer: Self-pay

## 2021-01-12 ENCOUNTER — Telehealth (INDEPENDENT_AMBULATORY_CARE_PROVIDER_SITE_OTHER): Payer: Self-pay

## 2021-01-12 ENCOUNTER — Telehealth (INDEPENDENT_AMBULATORY_CARE_PROVIDER_SITE_OTHER): Payer: BC Managed Care – PPO | Admitting: Family Medicine

## 2021-01-12 ENCOUNTER — Encounter (INDEPENDENT_AMBULATORY_CARE_PROVIDER_SITE_OTHER): Payer: Self-pay | Admitting: Family Medicine

## 2021-01-12 VITALS — Wt 212.0 lb

## 2021-01-12 DIAGNOSIS — Z6834 Body mass index (BMI) 34.0-34.9, adult: Secondary | ICD-10-CM | POA: Diagnosis not present

## 2021-01-12 DIAGNOSIS — Z9189 Other specified personal risk factors, not elsewhere classified: Secondary | ICD-10-CM

## 2021-01-12 DIAGNOSIS — E669 Obesity, unspecified: Secondary | ICD-10-CM | POA: Diagnosis not present

## 2021-01-12 DIAGNOSIS — F3289 Other specified depressive episodes: Secondary | ICD-10-CM | POA: Diagnosis not present

## 2021-01-12 NOTE — Telephone Encounter (Signed)
Verbal consent obtained to conduct video visit, via telehealth 

## 2021-01-13 DIAGNOSIS — Z1152 Encounter for screening for COVID-19: Secondary | ICD-10-CM | POA: Diagnosis not present

## 2021-01-13 NOTE — Progress Notes (Signed)
TeleHealth Visit:  Due to the COVID-19 pandemic, this visit was completed with telemedicine (audio/video) technology to reduce patient and provider exposure as well as to preserve personal protective equipment.   [name] has verbally consented to this TeleHealth visit. The patient is located at home, the provider is located at the [facility] office. The participants in this visit include the listed provider and patient. The visit was conducted today via video.  Chief Complaint: OBESITY Kendra Baird is here to discuss her progress with her obesity treatment plan along with follow-up of her obesity related diagnoses. Kendra Baird is on the Category 3 Plan and states she is following her eating plan approximately 95% of the time. Kendra Baird states she is exercising 0 minutes 0 times per week.  Today's visit was #: 19 Starting weight: 222 lbs Starting date: 02/26/2020  Interim History: Kendra Baird reports that she did well over the last 2 weeks. She is back on track. Patient is happy she is following the plan, but plans on getting back to exercising more regularly. She is working remotely this week and plans to get back to 2-3 days a week for 15-20 minutes, using [employer] app.  Assessment/Plan:   1. Other depression, with emotional eating Kendra Baird has been on Topamax for migraine management but we increased dose to help with emotional eating. Patient reports it is working well with no side effects. Kendra Baird denies need for refill today.  Plan: Continue current treatment plan. Exercise to help with emotional eating and keeping on track mentally and physically.  2. At risk for impaired metabolic function Kendra Baird was given approximately 15 minutes of impaired  metabolic function prevention counseling today. We discussed intensive lifestyle modifications today with an emphasis on specific nutrition and exercise instructions and strategies.   3. Class 1 obesity with serious comorbidity and body mass index  (BMI) of 34.0 to 34.9 in adult, unspecified obesity type Kendra Baird is currently in the action stage of change. As such, her goal is to continue with weight loss efforts. She has agreed to the Category 3 Plan.   Exercise goals: All adults should avoid inactivity. Some physical activity is better than none, and adults who participate in any amount of physical activity gain some health benefits. 2-3 days a week for 15-20 minutes is patients goal.  Behavioral modification strategies: meal planning and cooking strategies, emotional eating strategies and planning for success.  Kendra Baird has agreed to follow-up with our clinic in 2 weeks on 01/26/2021 at 0920. She was informed of the importance of frequent follow-up visits to maximize her success with intensive lifestyle modifications for her multiple health conditions.  Objective:   VITALS: Per patient if applicable, see vitals. GENERAL: Alert and in no acute distress. CARDIOPULMONARY: No increased WOB. Speaking in clear sentences.  PSYCH: Pleasant and cooperative. Speech normal rate and rhythm. Affect is appropriate. Insight and judgement are appropriate. Attention is focused, linear, and appropriate.  NEURO: Oriented as arrived to appointment on time with no prompting.   Lab Results  Component Value Date   CREATININE 0.75 07/09/2020   BUN 13 07/09/2020   NA 141 07/09/2020   K 4.5 07/09/2020   CL 105 07/09/2020   CO2 24 07/09/2020   Lab Results  Component Value Date   ALT 12 07/09/2020   AST 12 07/09/2020   ALKPHOS 109 07/09/2020   BILITOT 0.4 07/09/2020   Lab Results  Component Value Date   HGBA1C 5.1 02/26/2020   Lab Results  Component Value Date  INSULIN 2.5 (L) 02/26/2020   Lab Results  Component Value Date   TSH 0.560 06/10/2020   Lab Results  Component Value Date   CHOL 166 02/26/2020   HDL 68 02/26/2020   LDLCALC 89 02/26/2020   TRIG 41 02/26/2020   Lab Results  Component Value Date   WBC 7.5 02/26/2020   HGB 14.4  02/26/2020   HCT 41.7 02/26/2020   MCV 88 02/26/2020   PLT 214 02/26/2020   Lab Results  Component Value Date   IRON 87 02/26/2020   TIBC 319 02/26/2020   FERRITIN 71 02/26/2020    Attestation Statements:   Reviewed by clinician on day of visit: allergies, medications, problem list, medical history, surgical history, family history, social history, and previous encounter notes.  Edmund Hilda, am acting as Energy manager for [employer], DO.  I have reviewed the above documentation for accuracy and completeness, and I agree with the above. Carlye Grippe, D.O.  The 21st Century Cures Act was signed into law in 2016 which includes the topic of electronic health records.  This provides immediate access to information in MyChart.  This includes consultation notes, operative notes, office notes, lab results and pathology reports.  If you have any questions about what you read please let us know at your next visit so we can discuss your concerns and take corrective action if need be.  We are right here with you.

## 2021-01-15 DIAGNOSIS — Z1152 Encounter for screening for COVID-19: Secondary | ICD-10-CM | POA: Diagnosis not present

## 2021-01-20 DIAGNOSIS — M542 Cervicalgia: Secondary | ICD-10-CM | POA: Diagnosis not present

## 2021-01-20 DIAGNOSIS — M9902 Segmental and somatic dysfunction of thoracic region: Secondary | ICD-10-CM | POA: Diagnosis not present

## 2021-01-20 DIAGNOSIS — M9901 Segmental and somatic dysfunction of cervical region: Secondary | ICD-10-CM | POA: Diagnosis not present

## 2021-01-20 DIAGNOSIS — M62838 Other muscle spasm: Secondary | ICD-10-CM | POA: Diagnosis not present

## 2021-01-21 DIAGNOSIS — M62838 Other muscle spasm: Secondary | ICD-10-CM | POA: Diagnosis not present

## 2021-01-21 DIAGNOSIS — M542 Cervicalgia: Secondary | ICD-10-CM | POA: Diagnosis not present

## 2021-01-21 DIAGNOSIS — M9901 Segmental and somatic dysfunction of cervical region: Secondary | ICD-10-CM | POA: Diagnosis not present

## 2021-01-21 DIAGNOSIS — M9902 Segmental and somatic dysfunction of thoracic region: Secondary | ICD-10-CM | POA: Diagnosis not present

## 2021-01-26 ENCOUNTER — Other Ambulatory Visit: Payer: Self-pay

## 2021-01-26 ENCOUNTER — Encounter (INDEPENDENT_AMBULATORY_CARE_PROVIDER_SITE_OTHER): Payer: Self-pay | Admitting: Family Medicine

## 2021-01-26 ENCOUNTER — Telehealth (INDEPENDENT_AMBULATORY_CARE_PROVIDER_SITE_OTHER): Payer: BC Managed Care – PPO | Admitting: Family Medicine

## 2021-01-26 VITALS — Ht 66.0 in | Wt 211.0 lb

## 2021-01-26 DIAGNOSIS — G43809 Other migraine, not intractable, without status migrainosus: Secondary | ICD-10-CM | POA: Diagnosis not present

## 2021-01-26 DIAGNOSIS — E559 Vitamin D deficiency, unspecified: Secondary | ICD-10-CM | POA: Diagnosis not present

## 2021-01-26 DIAGNOSIS — Z9189 Other specified personal risk factors, not elsewhere classified: Secondary | ICD-10-CM | POA: Insufficient documentation

## 2021-01-26 DIAGNOSIS — E669 Obesity, unspecified: Secondary | ICD-10-CM | POA: Diagnosis not present

## 2021-01-26 DIAGNOSIS — Z6834 Body mass index (BMI) 34.0-34.9, adult: Secondary | ICD-10-CM

## 2021-01-26 MED ORDER — VITAMIN D (ERGOCALCIFEROL) 1.25 MG (50000 UNIT) PO CAPS: 50000.0000 [IU] | ORAL_CAPSULE | ORAL | 0 refills | Status: DC

## 2021-01-26 MED ORDER — TOPIRAMATE 50 MG PO TABS
ORAL_TABLET | ORAL | 0 refills | Status: DC
Start: 2021-01-26 — End: 2021-02-09

## 2021-01-27 NOTE — Progress Notes (Signed)
TeleHealth Visit:  Due to the COVID-19 pandemic, this visit was completed with telemedicine (audio/video) technology to reduce patient and provider exposure as well as to preserve personal protective equipment.   Kendra Baird has verbally consented to this TeleHealth visit. The patient is located at home, the provider is located at the [facility] office. The participants in this visit include the listed provider and patient. The visit was conducted today via video.  Chief Complaint: OBESITY Kendra Baird is here to discuss her progress with her obesity treatment plan along with follow-up of her obesity related diagnoses. Kendra Baird is on the Category 3 Plan and states she is following her eating plan approximately 90% of the time. Kendra Baird states she is exercising 0 minutes 0 times per week.  Today's visit was #: 20 Starting weight: 222 lbs Starting date: 02/26/2020  Interim History: Kendra Baird's last OV was a video visit on 01/12/2021. Her kids had positive COVID exposure at school. Her 52 year old son, Kendra Baird tested positive for COVID, and she and husband are now positive. She reports no issues with plan, but is unable to follow with illnesses in the house lately.  Assessment/Plan:   1. Vitamin D deficiency Kendra Baird's Vitamin D level was 67.7 on 12/16/2020. She is currently taking prescription vitamin D 50,000 IU each week. She denies nausea, vomiting or muscle weakness.   Ref. Range 12/16/2020 14:32  Vitamin D, 25-Hydroxy Latest Ref Range: 30.0 - 100.0 ng/mL 67.7   Plan: Refill Vit D for 1 month, as per below. Low Vitamin D level contributes to fatigue and are associated with obesity, breast, and colon cancer. She agrees to continue to take prescription Vitamin D @50 ,000 IU every week and will follow-up for routine testing of Vitamin D, at least 2-3 times per year to avoid over-replacement.  Refill- Vitamin D, Ergocalciferol, (DRISDOL) 1.25 MG (50000 UNIT) CAPS capsule; Take 1 capsule (50,000  Units total) by mouth every 7 (seven) days.  Dispense: 4 capsule; Refill: 0  2. Other migraine without status migrainosus, not intractable Pt is taking Topamax to help with emotional eating. She reports it is still working well. Pt has no concerns.  Plan: Refill Topamax for 1 month, as per below. Hydrate, prudent nutritional plan, and exercise to help with stressors, as well.  Refill- topiramate (TOPAMAX) 50 MG tablet; 50mg  qd plus 50mg  q hs  Dispense: 60 tablet; Refill: 0  3. Class 1 obesity with serious comorbidity and body mass index (BMI) of 34.0 to 34.9 in adult, unspecified obesity type Kendra Baird is currently in the action stage of change. As such, her goal is to continue with weight loss efforts. She has agreed to the Category 3 Plan.   Exercise goals: Try to hit goals of 20 minutes 2-3 days a week  Behavioral modification strategies: meal planning and cooking strategies, keeping healthy foods in the home and planning for success.  Kendra Baird has agreed to follow-up with our clinic in 2 weeks. She was informed of the importance of frequent follow-up visits to maximize her success with intensive lifestyle modifications for her multiple health conditions.  Objective:   VITALS: Per patient if applicable, see vitals. GENERAL: Alert and in no acute distress. CARDIOPULMONARY: No increased WOB. Speaking in clear sentences.  PSYCH: Pleasant and cooperative. Speech normal rate and rhythm. Affect is appropriate. Insight and judgement are appropriate. Attention is focused, linear, and appropriate.  NEURO: Oriented as arrived to appointment on time with no prompting.   Lab Results  Component Value Date  CREATININE 0.75 07/09/2020   BUN 13 07/09/2020   NA 141 07/09/2020   K 4.5 07/09/2020   CL 105 07/09/2020   CO2 24 07/09/2020   Lab Results  Component Value Date   ALT 12 07/09/2020   AST 12 07/09/2020   ALKPHOS 109 07/09/2020   BILITOT 0.4 07/09/2020   Lab Results  Component Value  Date   HGBA1C 5.1 02/26/2020   Lab Results  Component Value Date   INSULIN 2.5 (L) 02/26/2020   Lab Results  Component Value Date   TSH 0.560 06/10/2020   Lab Results  Component Value Date   CHOL 166 02/26/2020   HDL 68 02/26/2020   LDLCALC 89 02/26/2020   TRIG 41 02/26/2020   Lab Results  Component Value Date   WBC 7.5 02/26/2020   HGB 14.4 02/26/2020   HCT 41.7 02/26/2020   MCV 88 02/26/2020   PLT 214 02/26/2020   Lab Results  Component Value Date   IRON 87 02/26/2020   TIBC 319 02/26/2020   FERRITIN 71 02/26/2020    Attestation Statements:   Reviewed by clinician on day of visit: allergies, medications, problem list, medical history, surgical history, family history, social history, and previous encounter notes.  Edmund Hilda, am acting as Energy manager for [employer], DO.  I have reviewed the above documentation for accuracy and completeness, and I agree with the above. Carlye Grippe, D.O.  The 21st Century Cures Act was signed into law in 2016 which includes the topic of electronic health records.  This provides immediate access to information in MyChart.  This includes consultation notes, operative notes, office notes, lab results and pathology reports.  If you have any questions about what you read please let us know at your next visit so we can discuss your concerns and take corrective action if need be.  We are right here with you.

## 2021-02-02 DIAGNOSIS — M9902 Segmental and somatic dysfunction of thoracic region: Secondary | ICD-10-CM | POA: Diagnosis not present

## 2021-02-02 DIAGNOSIS — M542 Cervicalgia: Secondary | ICD-10-CM | POA: Diagnosis not present

## 2021-02-02 DIAGNOSIS — M62838 Other muscle spasm: Secondary | ICD-10-CM | POA: Diagnosis not present

## 2021-02-02 DIAGNOSIS — M9901 Segmental and somatic dysfunction of cervical region: Secondary | ICD-10-CM | POA: Diagnosis not present

## 2021-02-04 DIAGNOSIS — M9901 Segmental and somatic dysfunction of cervical region: Secondary | ICD-10-CM | POA: Diagnosis not present

## 2021-02-04 DIAGNOSIS — M62838 Other muscle spasm: Secondary | ICD-10-CM | POA: Diagnosis not present

## 2021-02-04 DIAGNOSIS — M9902 Segmental and somatic dysfunction of thoracic region: Secondary | ICD-10-CM | POA: Diagnosis not present

## 2021-02-04 DIAGNOSIS — M542 Cervicalgia: Secondary | ICD-10-CM | POA: Diagnosis not present

## 2021-02-05 DIAGNOSIS — M62838 Other muscle spasm: Secondary | ICD-10-CM | POA: Diagnosis not present

## 2021-02-05 DIAGNOSIS — M9901 Segmental and somatic dysfunction of cervical region: Secondary | ICD-10-CM | POA: Diagnosis not present

## 2021-02-05 DIAGNOSIS — M542 Cervicalgia: Secondary | ICD-10-CM | POA: Diagnosis not present

## 2021-02-05 DIAGNOSIS — M9902 Segmental and somatic dysfunction of thoracic region: Secondary | ICD-10-CM | POA: Diagnosis not present

## 2021-02-09 ENCOUNTER — Ambulatory Visit (INDEPENDENT_AMBULATORY_CARE_PROVIDER_SITE_OTHER): Payer: BC Managed Care – PPO | Admitting: Family Medicine

## 2021-02-09 ENCOUNTER — Encounter (INDEPENDENT_AMBULATORY_CARE_PROVIDER_SITE_OTHER): Payer: Self-pay | Admitting: Family Medicine

## 2021-02-09 ENCOUNTER — Other Ambulatory Visit: Payer: Self-pay

## 2021-02-09 VITALS — Wt 211.0 lb

## 2021-02-09 DIAGNOSIS — R632 Polyphagia: Secondary | ICD-10-CM | POA: Diagnosis not present

## 2021-02-09 DIAGNOSIS — E669 Obesity, unspecified: Secondary | ICD-10-CM | POA: Diagnosis not present

## 2021-02-09 DIAGNOSIS — M9901 Segmental and somatic dysfunction of cervical region: Secondary | ICD-10-CM | POA: Diagnosis not present

## 2021-02-09 DIAGNOSIS — M9902 Segmental and somatic dysfunction of thoracic region: Secondary | ICD-10-CM | POA: Diagnosis not present

## 2021-02-09 DIAGNOSIS — G43809 Other migraine, not intractable, without status migrainosus: Secondary | ICD-10-CM

## 2021-02-09 DIAGNOSIS — Z6834 Body mass index (BMI) 34.0-34.9, adult: Secondary | ICD-10-CM

## 2021-02-09 DIAGNOSIS — Z9189 Other specified personal risk factors, not elsewhere classified: Secondary | ICD-10-CM

## 2021-02-09 DIAGNOSIS — M62838 Other muscle spasm: Secondary | ICD-10-CM | POA: Diagnosis not present

## 2021-02-09 DIAGNOSIS — E559 Vitamin D deficiency, unspecified: Secondary | ICD-10-CM

## 2021-02-09 DIAGNOSIS — M542 Cervicalgia: Secondary | ICD-10-CM | POA: Diagnosis not present

## 2021-02-09 MED ORDER — VITAMIN D (ERGOCALCIFEROL) 1.25 MG (50000 UNIT) PO CAPS: 50000.0000 [IU] | ORAL_CAPSULE | ORAL | 0 refills | Status: DC

## 2021-02-09 MED ORDER — TOPIRAMATE 50 MG PO TABS
ORAL_TABLET | ORAL | 0 refills | Status: DC
Start: 2021-02-09 — End: 2021-03-03

## 2021-02-11 DIAGNOSIS — M542 Cervicalgia: Secondary | ICD-10-CM | POA: Diagnosis not present

## 2021-02-11 DIAGNOSIS — M9901 Segmental and somatic dysfunction of cervical region: Secondary | ICD-10-CM | POA: Diagnosis not present

## 2021-02-11 DIAGNOSIS — M9902 Segmental and somatic dysfunction of thoracic region: Secondary | ICD-10-CM | POA: Diagnosis not present

## 2021-02-11 DIAGNOSIS — M62838 Other muscle spasm: Secondary | ICD-10-CM | POA: Diagnosis not present

## 2021-02-12 DIAGNOSIS — M542 Cervicalgia: Secondary | ICD-10-CM | POA: Diagnosis not present

## 2021-02-12 DIAGNOSIS — M9901 Segmental and somatic dysfunction of cervical region: Secondary | ICD-10-CM | POA: Diagnosis not present

## 2021-02-12 DIAGNOSIS — M9902 Segmental and somatic dysfunction of thoracic region: Secondary | ICD-10-CM | POA: Diagnosis not present

## 2021-02-12 DIAGNOSIS — M62838 Other muscle spasm: Secondary | ICD-10-CM | POA: Diagnosis not present

## 2021-02-16 DIAGNOSIS — M542 Cervicalgia: Secondary | ICD-10-CM | POA: Diagnosis not present

## 2021-02-16 DIAGNOSIS — M62838 Other muscle spasm: Secondary | ICD-10-CM | POA: Diagnosis not present

## 2021-02-16 DIAGNOSIS — M9902 Segmental and somatic dysfunction of thoracic region: Secondary | ICD-10-CM | POA: Diagnosis not present

## 2021-02-16 DIAGNOSIS — M9901 Segmental and somatic dysfunction of cervical region: Secondary | ICD-10-CM | POA: Diagnosis not present

## 2021-02-16 NOTE — Progress Notes (Signed)
TeleHealth Visit:  Due to the COVID-19 pandemic, this visit was completed with telemedicine (audio/video) technology to reduce patient and provider exposure as well as to preserve personal protective equipment.   Kendra Baird has verbally consented to this TeleHealth visit. The patient is located at home, the provider is located at the [facility] office. The participants in this visit include the listed provider and patient and. The visit was conducted today via MyChart.  OBESITY Kendra Baird is here to discuss her progress with her obesity treatment plan along with follow-up of her obesity related diagnoses.   Today's visit was #: 21 Starting weight: 222 lbs Starting date: 02/26/2020 Today's date: 02/09/2021  Interim History: Kendra Baird had a birthday recently and has had some sweets cravings and also an increased desire for protein.  Nutrition Plan: Category 3 for 95% of the time. Activity: Cardio/yoga/resistance training for 60-90 minutes 5 days per week.  Assessment/Plan:   1. Polyphagia Especially with her menstrual cycle.  She has increased proteins and craves lean meat/chicken lately.    Plan:  Will check thyroid panel at next office visit.  Declines medications today after discussing risks and benefits.  Increase protein intake if desired.  Increase water intake as well.  - T4, free - T3 - TSH  2. Other migraine without status migrainosus, not intractable Stable symptoms.  Sees Neurology.  She is taking Topamax 50 mg twice daily.    Plan:  Will refill Topamax today, as per below.  Follow-up with Neurology as needed.  - Refill topiramate (TOPAMAX) 50 MG tablet; 50mg  qd plus 50mg  q hs  Dispense: 60 tablet; Refill: 0 - T4, free - T3 - TSH  3. Vitamin D deficiency At goal. Current vitamin D is 67.7, tested on 12/16/2020. Optimal goal > 50 ng/dL.   Plan: Continue to take prescription Vitamin D @50 ,000 IU every week as prescribed.  Follow-up for routine testing of  Vitamin D, at least 2-3 times per year to avoid over-replacement.  - Refill Vitamin D, Ergocalciferol, (DRISDOL) 1.25 MG (50000 UNIT) CAPS capsule; Take 1 capsule (50,000 Units total) by mouth every 7 (seven) days.  Dispense: 4 capsule; Refill: 0  4. Class 1 obesity with serious comorbidity and body mass index (BMI) of 34.0 to 34.9 in adult, unspecified obesity type  Course: Kendra Baird is currently in the action stage of change. As such, her goal is to continue with weight loss efforts.   Nutrition goals: She has agreed to the Category 3 Plan but increase protein at lunch to 6-8 ounces and at dinner to 10-14 ounces as desired.   Exercise goals: As is.  Behavioral modification strategies: increasing lean protein intake, decreasing simple carbohydrates and increasing water intake.  Kendra Baird has agreed to follow-up with our clinic in 2 weeks. She was informed of the importance of frequent follow-up visits to maximize her success with intensive lifestyle modifications for her multiple health conditions.   Objective:   Weight 211 lb (95.7 kg), currently breastfeeding. Body mass index is 34.06 kg/m.  General: Cooperative, alert, well developed, in no acute distress. HEENT: Conjunctivae and lids unremarkable. Cardiovascular: Regular rhythm.  Lungs: Normal work of breathing. Neurologic: No focal deficits.   Lab Results  Component Value Date   CREATININE 0.75 07/09/2020   BUN 13 07/09/2020   NA 141 07/09/2020   K 4.5 07/09/2020   CL 105 07/09/2020   CO2 24 07/09/2020   Lab Results  Component Value Date   ALT 12 07/09/2020  AST 12 07/09/2020   ALKPHOS 109 07/09/2020   BILITOT 0.4 07/09/2020   Lab Results  Component Value Date   HGBA1C 5.1 02/26/2020   Lab Results  Component Value Date   INSULIN 2.5 (L) 02/26/2020   Lab Results  Component Value Date   TSH 0.560 06/10/2020   Lab Results  Component Value Date   CHOL 166 02/26/2020   HDL 68 02/26/2020   LDLCALC 89 02/26/2020    TRIG 41 02/26/2020   Lab Results  Component Value Date   WBC 7.5 02/26/2020   HGB 14.4 02/26/2020   HCT 41.7 02/26/2020   MCV 88 02/26/2020   PLT 214 02/26/2020   Lab Results  Component Value Date   IRON 87 02/26/2020   TIBC 319 02/26/2020   FERRITIN 71 02/26/2020   Attestation Statements:   Reviewed by clinician on day of visit: allergies, medications, problem list, medical history, surgical history, family history, social history, and previous encounter notes.  I, Insurance claims handler, CMA, am acting as Energy manager for [employer], DO.  I have reviewed the above documentation for accuracy and completeness, and I agree with the above. Carlye Grippe, D.O.  The 21st Century Cures Act was signed into law in 2016 which includes the topic of electronic health records.  This provides immediate access to information in MyChart.  This includes consultation notes, operative notes, office notes, lab results and pathology reports.  If you have any questions about what you read please let us know at your next visit so we can discuss your concerns and take corrective action if need be.  We are right here with you.

## 2021-02-18 DIAGNOSIS — M62838 Other muscle spasm: Secondary | ICD-10-CM | POA: Diagnosis not present

## 2021-02-18 DIAGNOSIS — M9901 Segmental and somatic dysfunction of cervical region: Secondary | ICD-10-CM | POA: Diagnosis not present

## 2021-02-18 DIAGNOSIS — M62830 Muscle spasm of back: Secondary | ICD-10-CM | POA: Diagnosis not present

## 2021-02-18 DIAGNOSIS — M9902 Segmental and somatic dysfunction of thoracic region: Secondary | ICD-10-CM | POA: Diagnosis not present

## 2021-02-18 DIAGNOSIS — M542 Cervicalgia: Secondary | ICD-10-CM | POA: Diagnosis not present

## 2021-03-03 ENCOUNTER — Ambulatory Visit (INDEPENDENT_AMBULATORY_CARE_PROVIDER_SITE_OTHER): Payer: BC Managed Care – PPO | Admitting: Family Medicine

## 2021-03-03 ENCOUNTER — Encounter (INDEPENDENT_AMBULATORY_CARE_PROVIDER_SITE_OTHER): Payer: Self-pay | Admitting: Family Medicine

## 2021-03-03 ENCOUNTER — Other Ambulatory Visit: Payer: Self-pay

## 2021-03-03 VITALS — BP 109/76 | HR 85 | Temp 97.6°F | Ht 66.0 in | Wt 210.0 lb

## 2021-03-03 DIAGNOSIS — Z9189 Other specified personal risk factors, not elsewhere classified: Secondary | ICD-10-CM

## 2021-03-03 DIAGNOSIS — Z8669 Personal history of other diseases of the nervous system and sense organs: Secondary | ICD-10-CM

## 2021-03-03 DIAGNOSIS — E669 Obesity, unspecified: Secondary | ICD-10-CM | POA: Diagnosis not present

## 2021-03-03 DIAGNOSIS — E559 Vitamin D deficiency, unspecified: Secondary | ICD-10-CM | POA: Diagnosis not present

## 2021-03-03 DIAGNOSIS — G43809 Other migraine, not intractable, without status migrainosus: Secondary | ICD-10-CM

## 2021-03-03 DIAGNOSIS — R0602 Shortness of breath: Secondary | ICD-10-CM

## 2021-03-03 DIAGNOSIS — Z6833 Body mass index (BMI) 33.0-33.9, adult: Secondary | ICD-10-CM

## 2021-03-03 MED ORDER — TOPIRAMATE 50 MG PO TABS
ORAL_TABLET | ORAL | 0 refills | Status: DC
Start: 2021-03-03 — End: 2021-03-23

## 2021-03-03 MED ORDER — VITAMIN D (ERGOCALCIFEROL) 1.25 MG (50000 UNIT) PO CAPS: 50000.0000 [IU] | ORAL_CAPSULE | ORAL | 0 refills | Status: DC

## 2021-03-04 DIAGNOSIS — R0602 Shortness of breath: Secondary | ICD-10-CM | POA: Insufficient documentation

## 2021-03-04 DIAGNOSIS — Z9189 Other specified personal risk factors, not elsewhere classified: Secondary | ICD-10-CM | POA: Insufficient documentation

## 2021-03-05 NOTE — Progress Notes (Signed)
Chief Complaint:   OBESITY [name] is here to discuss her progress with her obesity treatment plan along with follow-up of her obesity related diagnoses.   Today's visit was #: 22 Starting weight: 222 lbs Starting date: 02/26/2020 Today's weight: 210 lbs Today's date: 03/03/2021 Total lbs lost to date: 12 lbs Body mass index is 33.89 kg/m.  Total weight loss percentage to date: -5.41%  Interim History:  Kendra Baird says she feels that the plan is not working.  At last office visit, she stated that she could not eat enough lean meats with increased cravings around her menses, and today she says it is way too much meat/food.  Plan:  Come in 30 minutes early to her next appointment for IC recheck.  At that appointment, will change plan pedning those results (gave her low carb, pescatarian plan information to look at).  Current Meal Plan: the Category 3 Plan but increase protein at lunch to 6-8 ounces and at dinner to 10-14 ounces as desired 95% of the time.  Current Exercise Plan: Yoga/cardio/weight training and resistance for 45 minutes 4 times per week.  Assessment/Plan:   1. SOB (shortness of breath) on exertion Evonna says she has been more short of breath recently.  No chest pain with exertion.  Exercises regularly.  Plan:  IC at next visit.  2. History of migraine headaches History of cravings.  She thinks Topamax really helps her.  Plan:  Refill Topamax today, as per below.  - Refill topiramate (TOPAMAX) 50 MG tablet; 50mg  qd plus 50mg  q hs  Dispense: 60 tablet; Refill: 0  3. Vitamin D deficiency At goal. Current vitamin D is 67.7, tested on 12/16/2020. Optimal goal > 50 ng/dL.  She is taking vitamin D 50,000 IU weekly.  Plan: Continue to take prescription Vitamin D @50 ,000 IU every week as prescribed.  Follow-up for routine testing of Vitamin D, at least 2-3 times per year to avoid over-replacement.  - Refill Vitamin D, Ergocalciferol, (DRISDOL) 1.25 MG (50000 UNIT) CAPS  capsule; Take 1 capsule (50,000 Units total) by mouth every 7 (seven) days.  Dispense: 4 capsule; Refill: 0  4. At risk for malnutrition Barbee was given extensive malnutrition prevention education and counseling today of more than 9 minutes.  Counseled her that malnutrition refers to inappropriate nutrients or not the right balance of nutrients for optimal health.  Discussed with Maleeha Halls that it is absolutely possible to be malnourished but yet obese.  Risk factors, including but not limited to, inappropriate dietary choices, difficulty with obtaining food due to physical or financial limitations, and various physical and mental health conditions were reviewed with Tarea Veitch.   5. Class 1 obesity with serious comorbidity and body mass index (BMI) of 33.0 to 33.9 in adult, unspecified obesity type  Course: [name] is currently in the action stage of change. As such, her goal is to continue with weight loss efforts.   Nutrition goals: She has agreed to the Category 3 Plan but increase protein at lunch to 6-8 ounces and at dinner to 10-14 ounces as desired.   Exercise goals: As is.  Behavioral modification strategies: increasing lean protein intake, decreasing simple carbohydrates, meal planning and cooking strategies, keeping healthy foods in the home and planning for success.  Consider Dr. referral in the future as well.  Finleigh has agreed to follow-up with our clinic in 2 weeks with  Cellar, NP.  Come 30 minutes early for IC recheck.  She was informed of  the importance of frequent follow-up visits to maximize her success with intensive lifestyle modifications for her multiple health conditions.   Objective:   Blood pressure 109/76, pulse 85, temperature 97.6 F (36.4 C), height 5\' 6"  (1.676 m), weight 210 lb (95.3 kg), SpO2 99 %, currently breastfeeding. Body mass index is 33.89 kg/m.  General: Cooperative, alert, well developed, in no acute distress. HEENT:  Conjunctivae and lids unremarkable. Cardiovascular: Regular rhythm.  Lungs: Normal work of breathing. Neurologic: No focal deficits.   Lab Results  Component Value Date   CREATININE 0.75 07/09/2020   BUN 13 07/09/2020   NA 141 07/09/2020   K 4.5 07/09/2020   CL 105 07/09/2020   CO2 24 07/09/2020   Lab Results  Component Value Date   ALT 12 07/09/2020   AST 12 07/09/2020   ALKPHOS 109 07/09/2020   BILITOT 0.4 07/09/2020   Lab Results  Component Value Date   HGBA1C 5.1 02/26/2020   Lab Results  Component Value Date   INSULIN 2.5 (L) 02/26/2020   Lab Results  Component Value Date   TSH 0.560 06/10/2020   Lab Results  Component Value Date   CHOL 166 02/26/2020   HDL 68 02/26/2020   LDLCALC 89 02/26/2020   TRIG 41 02/26/2020   Lab Results  Component Value Date   WBC 7.5 02/26/2020   HGB 14.4 02/26/2020   HCT 41.7 02/26/2020   MCV 88 02/26/2020   PLT 214 02/26/2020   Lab Results  Component Value Date   IRON 87 02/26/2020   TIBC 319 02/26/2020   FERRITIN 71 02/26/2020   Attestation Statements:   Reviewed by clinician on day of visit: allergies, medications, problem list, medical history, surgical history, family history, social history, and previous encounter notes.  I, 04/27/2020, CMA, am acting as Insurance claims handler for Energy manager, DO.  I have reviewed the above documentation for accuracy and completeness, and I agree with the above. [employer], D.O.  The 21st Century Cures Act was signed into law in 2016 which includes the topic of electronic health records.  This provides immediate access to information in MyChart.  This includes consultation notes, operative notes, office notes, lab results and pathology reports.  If you have any questions about what you read please let 2017 know at your next visit so we can discuss your concerns and take corrective action if need be.  We are right here with you.

## 2021-03-23 ENCOUNTER — Ambulatory Visit (INDEPENDENT_AMBULATORY_CARE_PROVIDER_SITE_OTHER): Payer: BC Managed Care – PPO | Admitting: Adult Health

## 2021-03-23 ENCOUNTER — Other Ambulatory Visit: Payer: Self-pay

## 2021-03-23 ENCOUNTER — Encounter (INDEPENDENT_AMBULATORY_CARE_PROVIDER_SITE_OTHER): Payer: Self-pay | Admitting: Adult Health

## 2021-03-23 VITALS — BP 109/73 | HR 70 | Temp 98.1°F | Ht 66.0 in | Wt 209.0 lb

## 2021-03-23 DIAGNOSIS — Z6835 Body mass index (BMI) 35.0-35.9, adult: Secondary | ICD-10-CM

## 2021-03-23 DIAGNOSIS — E559 Vitamin D deficiency, unspecified: Secondary | ICD-10-CM

## 2021-03-23 DIAGNOSIS — R0602 Shortness of breath: Secondary | ICD-10-CM

## 2021-03-23 DIAGNOSIS — E66812 Obesity, class 2: Secondary | ICD-10-CM

## 2021-03-23 DIAGNOSIS — Z8669 Personal history of other diseases of the nervous system and sense organs: Secondary | ICD-10-CM | POA: Diagnosis not present

## 2021-03-23 DIAGNOSIS — Z9189 Other specified personal risk factors, not elsewhere classified: Secondary | ICD-10-CM | POA: Diagnosis not present

## 2021-03-23 MED ORDER — VITAMIN D (ERGOCALCIFEROL) 1.25 MG (50000 UNIT) PO CAPS: 50000.0000 [IU] | ORAL_CAPSULE | ORAL | 0 refills | Status: DC

## 2021-03-23 MED ORDER — TOPIRAMATE 50 MG PO TABS
ORAL_TABLET | ORAL | 0 refills | Status: DC
Start: 2021-03-23 — End: 2021-04-28

## 2021-03-24 NOTE — Progress Notes (Signed)
Chief Complaint:   OBESITY Kendra Baird is here to discuss her progress with her obesity treatment plan along with follow-up of her obesity related diagnoses. Kendra Baird is on the Category 3 Plan and states she is following her eating plan approximately 90% of the time. Kendra Baird states she is doing a mix of cardio, yoga, and resistance 45 minutes 5 times per week.  Today's visit was #: 23 Starting weight: 222 lbs Starting date: 02/26/2020 Today's weight: 209 lbs Today's date: 03/23/2021 Total lbs lost to date: 13 lbs Total lbs lost since last in-office visit: 1 lb  Interim History: Kendra Baird's ultimate goal is to become stronger and "feel more like myself". She is not overly concerned with reaching a certain weight per scale. She is participating in yoga, resistance training, and various cardio exercises- 45 mins in duration- usually during her lunch break.  Subjective:   1. SOB (shortness of breath) on exertion Roseanna reports SOBE with extreme exertion. She denies chest pain with exertion. IC checked today- RMR 2024, slight decrease from initial RMR 2114 on 3/2/201.  2. History of migraine headaches Kendra Baird is on topiramate 50 mg BID and denies medication side effects. She denies history of cholelithiasis.   3. Vitamin D deficiency Kendra Baird's Vitamin D level was stable at 67.7 on 12/16/2020. She is currently taking prescription vitamin D 50,000 IU each week. She denies nausea, vomiting or muscle weakness.  4. At risk for deficient intake of food Kendra Baird is at risk for deficient intake of food due to regular exercise.  Assessment/Plan:   1. SOB (shortness of breath) on exertion Kendra Baird does feel that she gets out of breath more easily that she used to when she exercises. Kendra Baird's shortness of breath appears to be obesity related and exercise induced. She has agreed to work on weight loss and gradually increase exercise to treat her exercise induced shortness of breath. Will continue to monitor  closely.  IC checked today.  2. History of migraine headaches Continue Topamax 50 mg, as per below.  - topiramate (TOPAMAX) 50 MG tablet; 50mg  one in am and one in evening  Dispense: 60 tablet; Refill: 0  3. Vitamin D deficiency Low Vitamin D level contributes to fatigue and are associated with obesity, breast, and colon cancer. She agrees to continue to take prescription Vitamin D @50 ,000 IU every week and will follow-up for routine testing of Vitamin D, at least 2-3 times per year to avoid over-replacement.  - Vitamin D, Ergocalciferol, (DRISDOL) 1.25 MG (50000 UNIT) CAPS capsule; Take 1 capsule (50,000 Units total) by mouth every 7 (seven) days.  Dispense: 4 capsule; Refill: 0  4. At risk for deficient intake of food Kendra Baird was given approximately 15 minutes of deficit intake of food prevention counseling today. Kendra Baird is at risk for eating too few calories based on current food recall. She was encouraged to focus on meeting caloric and protein goals according to her recommended meal plan.   5. Current BMI 33.7 Kendra Baird is currently in the action stage of change. As such, her goal is to continue with weight loss efforts. She has agreed to the Category 3 Plan.   Fasting labs at next OV.  When exercising, add 100 extra snack calories on those days.  Exercise goals: As is  Behavioral modification strategies: increasing lean protein intake, meal planning and cooking strategies and planning for success.  Kendra Baird has agreed to follow-up with our clinic in 2-3 weeks. She was informed of the importance of frequent follow-up  visits to maximize her success with intensive lifestyle modifications for her multiple health conditions.   Objective:   Blood pressure 109/73, pulse 70, temperature 98.1 F (36.7 C), height 5\' 6"  (1.676 m), weight 209 lb (94.8 kg), SpO2 97 %, currently breastfeeding. Body mass index is 33.73 kg/m.  General: Cooperative, alert, well developed, in no acute  distress. HEENT: Conjunctivae and lids unremarkable. Cardiovascular: Regular rhythm.  Lungs: Normal work of breathing. Neurologic: No focal deficits.   Lab Results  Component Value Date   CREATININE 0.75 07/09/2020   BUN 13 07/09/2020   NA 141 07/09/2020   K 4.5 07/09/2020   CL 105 07/09/2020   CO2 24 07/09/2020   Lab Results  Component Value Date   ALT 12 07/09/2020   AST 12 07/09/2020   ALKPHOS 109 07/09/2020   BILITOT 0.4 07/09/2020   Lab Results  Component Value Date   HGBA1C 5.1 02/26/2020   Lab Results  Component Value Date   INSULIN 2.5 (L) 02/26/2020   Lab Results  Component Value Date   TSH 0.560 06/10/2020   Lab Results  Component Value Date   CHOL 166 02/26/2020   HDL 68 02/26/2020   LDLCALC 89 02/26/2020   TRIG 41 02/26/2020   Lab Results  Component Value Date   WBC 7.5 02/26/2020   HGB 14.4 02/26/2020   HCT 41.7 02/26/2020   MCV 88 02/26/2020   PLT 214 02/26/2020   Lab Results  Component Value Date   IRON 87 02/26/2020   TIBC 319 02/26/2020   FERRITIN 71 02/26/2020    Attestation Statements:   Reviewed by clinician on day of visit: allergies, medications, problem list, medical history, surgical history, family history, social history, and previous encounter notes.  04/27/2020, am acting as Edmund Hilda for Energy manager, NP.  I have reviewed the above documentation for accuracy and completeness, and I agree with the above. -  Magnolia Mattila d. Jacquel Mccamish, NP-C

## 2021-04-09 ENCOUNTER — Ambulatory Visit (INDEPENDENT_AMBULATORY_CARE_PROVIDER_SITE_OTHER): Payer: BC Managed Care – PPO | Admitting: Family Medicine

## 2021-04-09 DIAGNOSIS — Z6834 Body mass index (BMI) 34.0-34.9, adult: Secondary | ICD-10-CM | POA: Diagnosis not present

## 2021-04-09 DIAGNOSIS — E669 Obesity, unspecified: Secondary | ICD-10-CM | POA: Diagnosis not present

## 2021-04-09 DIAGNOSIS — R632 Polyphagia: Secondary | ICD-10-CM | POA: Diagnosis not present

## 2021-04-09 DIAGNOSIS — G43809 Other migraine, not intractable, without status migrainosus: Secondary | ICD-10-CM | POA: Diagnosis not present

## 2021-04-10 LAB — TSH: TSH: 0.471 u[IU]/mL (ref 0.450–4.500)

## 2021-04-10 LAB — T4, FREE: Free T4: 1.52 ng/dL (ref 0.82–1.77)

## 2021-04-10 LAB — T3: T3, Total: 133 ng/dL (ref 71–180)

## 2021-04-28 ENCOUNTER — Other Ambulatory Visit: Payer: Self-pay

## 2021-04-28 ENCOUNTER — Encounter (INDEPENDENT_AMBULATORY_CARE_PROVIDER_SITE_OTHER): Payer: Self-pay | Admitting: Family Medicine

## 2021-04-28 ENCOUNTER — Ambulatory Visit (INDEPENDENT_AMBULATORY_CARE_PROVIDER_SITE_OTHER): Payer: BC Managed Care – PPO | Admitting: Family Medicine

## 2021-04-28 VITALS — BP 102/71 | HR 75 | Temp 97.9°F | Ht 66.0 in | Wt 207.0 lb

## 2021-04-28 DIAGNOSIS — E559 Vitamin D deficiency, unspecified: Secondary | ICD-10-CM

## 2021-04-28 DIAGNOSIS — Z8669 Personal history of other diseases of the nervous system and sense organs: Secondary | ICD-10-CM

## 2021-04-28 DIAGNOSIS — Z9189 Other specified personal risk factors, not elsewhere classified: Secondary | ICD-10-CM | POA: Diagnosis not present

## 2021-04-28 DIAGNOSIS — Z6835 Body mass index (BMI) 35.0-35.9, adult: Secondary | ICD-10-CM | POA: Diagnosis not present

## 2021-04-28 DIAGNOSIS — E66812 Obesity, class 2: Secondary | ICD-10-CM

## 2021-04-28 MED ORDER — TOPIRAMATE 50 MG PO TABS: ORAL_TABLET | ORAL | 0 refills | Status: DC

## 2021-04-28 MED ORDER — VITAMIN D (ERGOCALCIFEROL) 1.25 MG (50000 UNIT) PO CAPS: 50000.0000 [IU] | ORAL_CAPSULE | ORAL | 0 refills | Status: DC

## 2021-05-06 NOTE — Progress Notes (Signed)
Chief Complaint:   OBESITY Kendra Baird is here to discuss her progress with her obesity treatment plan along with follow-up of her obesity related diagnoses.   Today's visit was #: 24 Starting weight: 222 lbs Starting date: 02/26/2020 Today's weight: 207 lbs Today's date: 04/28/2021 Weight change since last visit:  2 lbs Total lbs lost to date: 15 lbs Body mass index is 33.41 kg/m.  Total weight loss percentage to date: -6.76%  Interim History:  Kendra Baird is here for a follow up office visit and she is following the meal plan without concerns or issues.  Patient's meal and food recall appears to be accurate and consistent with what is on the plan.  When on plan, her hunger and cravings are well controlled.    Kendra Baird says she is feeling great.  She is being more mindful with her eating habits.  Happy with progress.  Current Meal Plan: the Category 3 Plan for 95% of the time.  Current Exercise Plan: Yoga, walking, cardio, weights for 45 minutes 5 times per week.  Assessment/Plan:   No orders of the defined types were placed in this encounter.   Medications Discontinued During This Encounter  Medication Reason  . topiramate (TOPAMAX) 50 MG tablet Reorder  . Vitamin D, Ergocalciferol, (DRISDOL) 1.25 MG (50000 UNIT) CAPS capsule Reorder     Meds ordered this encounter  Medications  . topiramate (TOPAMAX) 50 MG tablet    Sig: 50mg  one in am and one in evening    Dispense:  60 tablet    Refill:  0  . Vitamin D, Ergocalciferol, (DRISDOL) 1.25 MG (50000 UNIT) CAPS capsule    Sig: Take 1 capsule (50,000 Units total) by mouth every 7 (seven) days.    Dispense:  4 capsule    Refill:  0    1. Vitamin D deficiency At goal. Current vitamin D is 67.7, tested on 12/16/2020. Optimal goal > 50 ng/dL.  She is taking vitamin D 50,000 IU weekly.  Plan: Continue to take prescription Vitamin D @50 ,000 IU every week as prescribed.  Follow-up for routine testing of Vitamin D, at least 2-3  times per year to avoid over-replacement.  - Refill Vitamin D, Ergocalciferol, (DRISDOL) 1.25 MG (50000 UNIT) CAPS capsule; Take 1 capsule (50,000 Units total) by mouth every 7 (seven) days.  Dispense: 4 capsule; Refill: 0  2. History of migraine headaches Kendra Baird takes Topamax 50 mg twice daily for migraine prevention.  She also takes Imitrex 100 mg at headache onset for abortive therapy.  Plan:  Continue medications.  Will refill Topamax today, as per below.  - Refill topiramate (TOPAMAX) 50 MG tablet; 50mg  one in am and one in evening  Dispense: 60 tablet; Refill: 0  3. At risk for impaired metabolic function Due to Kendra Baird current state of health and medical condition(s), she is at a significantly higher risk for impaired metabolic function.   At least 8 minutes was spent on counseling Kendra Baird about these concerns today.  This places the patient at a much greater risk to subsequently develop cardio-pulmonary conditions that can negatively affect the patient's quality of life.  I stressed the importance of reversing these risks factors.  The initial goal is to lose at least 5-10% of starting weight to help reduce risk factors.  Counseling:  Intensive lifestyle modifications discussed with Kendra Baird as the most appropriate first line treatment.  she will continue to work on diet, exercise, and weight loss efforts.  We will continue to  reassess these conditions on a fairly regular basis in an attempt to decrease the patient's overall morbidity and mortality.  4. Obesity, current BMI 33.4  Course: Kendra Baird is currently in the action stage of change. As such, her goal is to continue with weight loss efforts.   Nutrition goals: She has agreed to the Category 3 Plan and keeping a food journal and adhering to recommended goals of 1500-1700 calories or 120+ grams of protein.   Exercise goals: As is.  Behavioral modification strategies: meal planning and cooking strategies and planning for  success.  Kendra Baird has agreed to follow-up with our clinic in 3 weeks. She was informed of the importance of frequent follow-up visits to maximize her success with intensive lifestyle modifications for her multiple health conditions.   Objective:   Blood pressure 102/71, pulse 75, temperature 97.9 F (36.6 C), height 5\' 6"  (1.676 m), weight 207 lb (93.9 kg), SpO2 98 %, currently breastfeeding. Body mass index is 33.41 kg/m.  General: Cooperative, alert, well developed, in no acute distress. HEENT: Conjunctivae and lids unremarkable. Cardiovascular: Regular rhythm.  Lungs: Normal work of breathing. Neurologic: No focal deficits.   Lab Results  Component Value Date   CREATININE 0.75 07/09/2020   BUN 13 07/09/2020   NA 141 07/09/2020   K 4.5 07/09/2020   CL 105 07/09/2020   CO2 24 07/09/2020   Lab Results  Component Value Date   ALT 12 07/09/2020   AST 12 07/09/2020   ALKPHOS 109 07/09/2020   BILITOT 0.4 07/09/2020   Lab Results  Component Value Date   HGBA1C 5.1 02/26/2020   Lab Results  Component Value Date   INSULIN 2.5 (L) 02/26/2020   Lab Results  Component Value Date   TSH 0.471 04/09/2021   Lab Results  Component Value Date   CHOL 166 02/26/2020   HDL 68 02/26/2020   LDLCALC 89 02/26/2020   TRIG 41 02/26/2020   Lab Results  Component Value Date   WBC 7.5 02/26/2020   HGB 14.4 02/26/2020   HCT 41.7 02/26/2020   MCV 88 02/26/2020   PLT 214 02/26/2020   Lab Results  Component Value Date   IRON 87 02/26/2020   TIBC 319 02/26/2020   FERRITIN 71 02/26/2020   Attestation Statements:   Reviewed by clinician on day of visit: allergies, medications, problem list, medical history, surgical history, family history, social history, and previous encounter notes.  I, 04/27/2020, CMA, am acting as Insurance claims handler for Energy manager, DO.  I have reviewed the above documentation for accuracy and completeness, and I agree with the above. [employer],  D.O.  The 21st Century Cures Act was signed into law in 2016 which includes the topic of electronic health records.  This provides immediate access to information in MyChart.  This includes consultation notes, operative notes, office notes, lab results and pathology reports.  If you have any questions about what you read please let 2017 know at your next visit so we can discuss your concerns and take corrective action if need be.  We are right here with you.

## 2021-05-19 ENCOUNTER — Other Ambulatory Visit: Payer: Self-pay

## 2021-05-19 ENCOUNTER — Ambulatory Visit (INDEPENDENT_AMBULATORY_CARE_PROVIDER_SITE_OTHER): Payer: BC Managed Care – PPO | Admitting: Family Medicine

## 2021-05-19 ENCOUNTER — Other Ambulatory Visit (INDEPENDENT_AMBULATORY_CARE_PROVIDER_SITE_OTHER): Payer: Self-pay | Admitting: Family Medicine

## 2021-05-19 ENCOUNTER — Encounter (INDEPENDENT_AMBULATORY_CARE_PROVIDER_SITE_OTHER): Payer: Self-pay | Admitting: Family Medicine

## 2021-05-19 VITALS — BP 94/65 | HR 83 | Temp 97.7°F | Ht 66.0 in | Wt 207.0 lb

## 2021-05-19 DIAGNOSIS — Z6835 Body mass index (BMI) 35.0-35.9, adult: Secondary | ICD-10-CM | POA: Diagnosis not present

## 2021-05-19 DIAGNOSIS — Z9189 Other specified personal risk factors, not elsewhere classified: Secondary | ICD-10-CM | POA: Diagnosis not present

## 2021-05-19 DIAGNOSIS — E559 Vitamin D deficiency, unspecified: Secondary | ICD-10-CM | POA: Diagnosis not present

## 2021-05-19 DIAGNOSIS — Z8669 Personal history of other diseases of the nervous system and sense organs: Secondary | ICD-10-CM | POA: Diagnosis not present

## 2021-05-19 MED ORDER — SEMAGLUTIDE-WEIGHT MANAGEMENT 0.25 MG/0.5ML ~~LOC~~ SOAJ: SUBCUTANEOUS | 0 refills | Status: AC

## 2021-05-19 MED ORDER — VITAMIN D (ERGOCALCIFEROL) 1.25 MG (50000 UNIT) PO CAPS: 50000.0000 [IU] | ORAL_CAPSULE | ORAL | 0 refills | Status: DC

## 2021-05-19 MED ORDER — TOPIRAMATE 50 MG PO TABS
ORAL_TABLET | ORAL | 0 refills | Status: DC
Start: 2021-05-19 — End: 2021-06-09

## 2021-05-19 NOTE — Telephone Encounter (Signed)
PA being worked on

## 2021-05-20 DIAGNOSIS — M542 Cervicalgia: Secondary | ICD-10-CM | POA: Diagnosis not present

## 2021-05-20 DIAGNOSIS — M9901 Segmental and somatic dysfunction of cervical region: Secondary | ICD-10-CM | POA: Diagnosis not present

## 2021-05-20 DIAGNOSIS — M62838 Other muscle spasm: Secondary | ICD-10-CM | POA: Diagnosis not present

## 2021-05-20 DIAGNOSIS — M9902 Segmental and somatic dysfunction of thoracic region: Secondary | ICD-10-CM | POA: Diagnosis not present

## 2021-05-26 NOTE — Telephone Encounter (Signed)
Prior Berkley Harvey has been denied.  This request has received a Unfavorable outcome.  Please see letter faxed to your office for details on this adverse benefit determination.  Please note any additional information provided by [facility] at the bottom of this request.

## 2021-05-27 NOTE — Progress Notes (Signed)
Chief Complaint:   OBESITY Kendra Baird is here to discuss her progress with her obesity treatment plan along with follow-up of her obesity related diagnoses.   Today's visit was #: 25 Starting weight: 222 lbs Starting date: 02/26/2020 Today's weight: 207 lbs Today's date: 05/19/2021 Weight change since last visit: 0 Total lbs lost to date: 15 lbs Body mass index is 33.41 kg/m.  Total weight loss percentage to date: -6.76%  Interim History:  Kendra Baird's 30-year-old had a birthday recently, and the party was over the weekend.  She ate off plan.  No hunger.  Some cravings around her menstrual cycle for 1-2 days.  She says this does not derail her.  Plan:  Start Wegovy 0.25 mg weekly.  She is unsure about insurance coverage for obesity and knows it may be cost prohibitive.   Current Meal Plan: the Category 3 Plan and keeping a food journal and adhering to recommended goals of 1500-1700 calories and 120 grams of protein for 90% of the time.  Current Exercise Plan: Yoga for 45 minutes 2 times per week.  Assessment/Plan:   Medications Discontinued During This Encounter  Medication Reason   topiramate (TOPAMAX) 50 MG tablet Reorder   Vitamin D, Ergocalciferol, (DRISDOL) 1.25 MG (50000 UNIT) CAPS capsule Reorder   Meds ordered this encounter  Medications   topiramate (TOPAMAX) 50 MG tablet    Sig: 50mg  one in am and one in evening    Dispense:  60 tablet    Refill:  0   Vitamin D, Ergocalciferol, (DRISDOL) 1.25 MG (50000 UNIT) CAPS capsule    Sig: Take 1 capsule (50,000 Units total) by mouth every 7 (seven) days.    Dispense:  4 capsule    Refill:  0   Semaglutide-Weight Management 0.25 MG/0.5ML SOAJ    Sig: Inject 0.25 mg into the skin once a week for 14 days, THEN 0.5 mg once a week for 14 days.    Dispense:  3 mL    Refill:  0    1. History of migraine headaches Kendra Baird is drinking 100 ounces of water per day.  She is taking Topamax 50 mg twice daily for migraine prevention.    Plan:  Will refill Topamax 50 mg twice daily.  - Refill topiramate (TOPAMAX) 50 MG tablet; 50mg  one in am and one in evening  Dispense: 60 tablet; Refill: 0  2. Vitamin D deficiency At goal. Current vitamin D is 67.7, tested on 12/16/2020. Optimal goal > 50 ng/dL.  She is taking vitamin D 50,000 IU weekly.  Plan: Continue to take prescription Vitamin D @50 ,000 IU every week as prescribed.  Follow-up for routine testing of Vitamin D, at least 2-3 times per year to avoid over-replacement.  - Refill Vitamin D, Ergocalciferol, (DRISDOL) 1.25 MG (50000 UNIT) CAPS capsule; Take 1 capsule (50,000 Units total) by mouth every 7 (seven) days.  Dispense: 4 capsule; Refill: 0  3. At risk for dehydration Kendra Baird is at higher than average risk of dehydration with summer ahead and increasing her exercise.  Kendra Baird was given more than 8 minutes of proper hydration counseling today.  We discussed the signs and symptoms of dehydration, some of which may include muscle cramping, constipation or even orthostatic symptoms.  Counseling on the prevention of dehydration was also provided today.  Kendra Baird is at risk for dehydration due to weight loss, lifestyle and behavorial habits and possibly due to taking certain medication(s).  She was encouraged to adequately hydrate and monitor fluid status  to avoid dehydration as well as weight loss plateaus.  Unless pre-existing renal or cardiopulmonary conditions exist, in which patient was told to limit their fluid intake, I recommended roughly one half of their weight in pounds to be the approximate ounces of non-caloric, non-caffeinated beverages they should drink per day; including more if they are engaging in exercise.  4. Obesity, current BMI 33.4  - Start Semaglutide-Weight Management 0.25 MG/0.5ML SOAJ; Inject 0.25 mg into the skin once a week for 14 days, THEN 0.5 mg once a week for 14 days.  Dispense: 3 mL; Refill: 0  Course: Kendra Baird is currently in the action stage of  change. As such, her goal is to continue with weight loss efforts.   Nutrition goals: She has agreed to the Category 3 Plan and keeping a food journal and adhering to recommended goals of 1500-1700 calories and 120 grams of protein.   Exercise goals: For substantial health benefits, adults should do at least 150 minutes (2 hours and 30 minutes) a week of moderate-intensity, or 75 minutes (1 hour and 15 minutes) a week of vigorous-intensity aerobic physical activity, or an equivalent combination of moderate- and vigorous-intensity aerobic activity. Aerobic activity should be performed in episodes of at least 10 minutes, and preferably, it should be spread throughout the week.  Behavioral modification strategies: increasing water intake, celebration eating strategies and planning for success.  Kendra Baird has agreed to follow-up with our clinic in 2 weeks. She was informed of the importance of frequent follow-up visits to maximize her success with intensive lifestyle modifications for her multiple health conditions.   Objective:   Blood pressure 94/65, pulse 83, temperature 97.7 F (36.5 C), height 5\' 6"  (1.676 m), weight 207 lb (93.9 kg), SpO2 99 %, currently breastfeeding. Body mass index is 33.41 kg/m.  General: Cooperative, alert, well developed, in no acute distress. HEENT: Conjunctivae and lids unremarkable. Cardiovascular: Regular rhythm.  Lungs: Normal work of breathing. Neurologic: No focal deficits.   Lab Results  Component Value Date   CREATININE 0.75 07/09/2020   BUN 13 07/09/2020   NA 141 07/09/2020   K 4.5 07/09/2020   CL 105 07/09/2020   CO2 24 07/09/2020   Lab Results  Component Value Date   ALT 12 07/09/2020   AST 12 07/09/2020   ALKPHOS 109 07/09/2020   BILITOT 0.4 07/09/2020   Lab Results  Component Value Date   HGBA1C 5.1 02/26/2020   Lab Results  Component Value Date   INSULIN 2.5 (L) 02/26/2020   Lab Results  Component Value Date   TSH 0.471 04/09/2021    Lab Results  Component Value Date   CHOL 166 02/26/2020   HDL 68 02/26/2020   LDLCALC 89 02/26/2020   TRIG 41 02/26/2020   Lab Results  Component Value Date   WBC 7.5 02/26/2020   HGB 14.4 02/26/2020   HCT 41.7 02/26/2020   MCV 88 02/26/2020   PLT 214 02/26/2020   Lab Results  Component Value Date   IRON 87 02/26/2020   TIBC 319 02/26/2020   FERRITIN 71 02/26/2020   Attestation Statements:   Reviewed by clinician on day of visit: allergies, medications, problem list, medical history, surgical history, family history, social history, and previous encounter notes.  I, 04/27/2020, CMA, am acting as Insurance claims handler for Energy manager, DO.  I have reviewed the above documentation for accuracy and completeness, and I agree with the above. [employer], D.O.  The 21st Century Cures Act was signed  into law in 2016 which includes the topic of electronic health records.  This provides immediate access to information in MyChart.  This includes consultation notes, operative notes, office notes, lab results and pathology reports.  If you have any questions about what you read please let us know at your next visit so we can discuss your concerns and take corrective action if need be.  We are right here with you.

## 2021-06-01 ENCOUNTER — Other Ambulatory Visit (INDEPENDENT_AMBULATORY_CARE_PROVIDER_SITE_OTHER): Payer: Self-pay | Admitting: Adult Health

## 2021-06-01 DIAGNOSIS — Z8669 Personal history of other diseases of the nervous system and sense organs: Secondary | ICD-10-CM

## 2021-06-09 ENCOUNTER — Ambulatory Visit (INDEPENDENT_AMBULATORY_CARE_PROVIDER_SITE_OTHER): Payer: BC Managed Care – PPO | Admitting: Adult Health

## 2021-06-09 ENCOUNTER — Encounter (INDEPENDENT_AMBULATORY_CARE_PROVIDER_SITE_OTHER): Payer: Self-pay | Admitting: Adult Health

## 2021-06-09 ENCOUNTER — Other Ambulatory Visit: Payer: Self-pay

## 2021-06-09 VITALS — BP 102/71 | HR 70 | Temp 97.6°F | Ht 66.0 in | Wt 204.0 lb

## 2021-06-09 DIAGNOSIS — E559 Vitamin D deficiency, unspecified: Secondary | ICD-10-CM | POA: Diagnosis not present

## 2021-06-09 DIAGNOSIS — Z9189 Other specified personal risk factors, not elsewhere classified: Secondary | ICD-10-CM

## 2021-06-09 DIAGNOSIS — Z8669 Personal history of other diseases of the nervous system and sense organs: Secondary | ICD-10-CM

## 2021-06-09 DIAGNOSIS — Z6835 Body mass index (BMI) 35.0-35.9, adult: Secondary | ICD-10-CM | POA: Diagnosis not present

## 2021-06-09 MED ORDER — TOPIRAMATE 50 MG PO TABS: ORAL_TABLET | ORAL | 0 refills | Status: DC

## 2021-06-11 NOTE — Progress Notes (Signed)
Chief Complaint:   OBESITY Kendra Baird is here to discuss her progress with her obesity treatment plan along with follow-up of her obesity related diagnoses. Kendra Baird is on the Category 3 Plan and keeping a food journal and adhering to recommended goals of 1500-1700 calories and 120 g protein and states she is following her eating plan approximately 98% of the time. Kendra Baird states she is doing resistance training, cardio, and yoga 45-60 minutes 5 times per week.  Today's visit was #: 26 Starting weight: 222 lbs Starting date: 02/26/2020 Today's weight: 204 lbs Today's date: 06/09/2021 Total lbs lost to date: 18 Total lbs lost since last in-office visit: 3  Interim History: Kendra Baird and her extended family will be traveling to [place] for 7 days- enjoy! She averages >= 130 grams of protein per day and has decreased carb/sugar intake. Of Note: Kendra Baird was not covered by her insurance.  Subjective:   1. History of migraine headaches Kendra Baird reports dramatic incidence in migraine headache. She estimated to have 1-2 HA since starting topiramate. She denies history of current nephrolithiasis- last kidney stone was >10 years ago. She is taking 1 topiramate 50 mg every other day.  2. Vitamin D deficiency Kendra Baird's Vitamin D level was 67.7 on 12/16/2020. She is currently taking OTC vitamin D 5,000 IU each day.  Needs labs to be updated.  Assessment/Plan:   1. History of migraine headaches Refill topiramate 50 mg, as prescribed below.  - topiramate (TOPAMAX) 50 MG tablet; 50mg  one in am and one in evening  Dispense: 60 tablet; Refill: 0  2. Vitamin D deficiency Low Vitamin D level contributes to fatigue and are associated with obesity, breast, and colon cancer. She agrees to continue to take OTC  Vitamin D @5 ,000 IU QD and will follow-up for routine testing of Vitamin D, at least 2-3 times per year to avoid over-replacement. -Check fasting labs at next OV.  3. At risk for osteoporosis Kendra Baird was  given approximately 15 minutes of osteoporosis prevention counseling today. Kendra Baird is at risk for osteopenia and osteoporosis due to her Vitamin D deficiency. She was encouraged to take her Vitamin D and follow her higher calcium diet and increase strengthening exercise to help strengthen her bones and decrease her risk of osteopenia and osteoporosis.  Repetitive spaced learning was employed today to elicit superior memory formation and behavioral change.  4. Obesity, current BMI 32.9  Kendra Baird is currently in the action stage of change. As such, her goal is to continue with weight loss efforts. She has agreed to the Category 3 Plan and keeping a food journal and adhering to recommended goals of 1500-1700 calories and 120 g protein.   Check fasting labs at next OV.  Exercise goals:  As is  Behavioral modification strategies: increasing lean protein intake, decreasing simple carbohydrates, meal planning and cooking strategies, keeping healthy foods in the home, and planning for success.  Kendra Baird has agreed to follow-up with our clinic in 2 weeks. She was informed of the importance of frequent follow-up visits to maximize her success with intensive lifestyle modifications for her multiple health conditions.   Objective:   Blood pressure 102/71, pulse 70, temperature 97.6 F (36.4 C), height 5\' 6"  (1.676 m), weight 204 lb (92.5 kg), SpO2 99 %, currently breastfeeding. Body mass index is 32.93 kg/m.  General: Cooperative, alert, well developed, in no acute distress. HEENT: Conjunctivae and lids unremarkable. Cardiovascular: Regular rhythm.  Lungs: Normal work of breathing. Neurologic: No focal deficits.   Lab  Results  Component Value Date   CREATININE 0.75 07/09/2020   BUN 13 07/09/2020   NA 141 07/09/2020   K 4.5 07/09/2020   CL 105 07/09/2020   CO2 24 07/09/2020   Lab Results  Component Value Date   ALT 12 07/09/2020   AST 12 07/09/2020   ALKPHOS 109 07/09/2020   BILITOT 0.4  07/09/2020   Lab Results  Component Value Date   HGBA1C 5.1 02/26/2020   Lab Results  Component Value Date   INSULIN 2.5 (L) 02/26/2020   Lab Results  Component Value Date   TSH 0.471 04/09/2021   Lab Results  Component Value Date   CHOL 166 02/26/2020   HDL 68 02/26/2020   LDLCALC 89 02/26/2020   TRIG 41 02/26/2020   Lab Results  Component Value Date   WBC 7.5 02/26/2020   HGB 14.4 02/26/2020   HCT 41.7 02/26/2020   MCV 88 02/26/2020   PLT 214 02/26/2020   Lab Results  Component Value Date   IRON 87 02/26/2020   TIBC 319 02/26/2020   FERRITIN 71 02/26/2020    Attestation Statements:   Reviewed by clinician on day of visit: allergies, medications, problem list, medical history, surgical history, family history, social history, and previous encounter notes.  Edmund Hilda, CMA, am acting as transcriptionist for William Hamburger, NP.  I have reviewed the above documentation for accuracy and completeness, and I agree with the above. -  Ayad Nieman d. Faryal Marxen, NP-C

## 2021-06-23 ENCOUNTER — Ambulatory Visit (INDEPENDENT_AMBULATORY_CARE_PROVIDER_SITE_OTHER): Payer: BC Managed Care – PPO | Admitting: Adult Health

## 2021-07-09 ENCOUNTER — Ambulatory Visit (INDEPENDENT_AMBULATORY_CARE_PROVIDER_SITE_OTHER): Payer: BC Managed Care – PPO | Admitting: Adult Health

## 2021-07-28 ENCOUNTER — Encounter (INDEPENDENT_AMBULATORY_CARE_PROVIDER_SITE_OTHER): Payer: Self-pay | Admitting: Adult Health

## 2021-07-28 ENCOUNTER — Ambulatory Visit (INDEPENDENT_AMBULATORY_CARE_PROVIDER_SITE_OTHER): Payer: BC Managed Care – PPO | Admitting: Adult Health

## 2021-07-28 ENCOUNTER — Other Ambulatory Visit: Payer: Self-pay

## 2021-07-28 VITALS — BP 106/64 | HR 67 | Temp 97.7°F | Ht 66.0 in | Wt 211.0 lb

## 2021-07-28 DIAGNOSIS — Z6835 Body mass index (BMI) 35.0-35.9, adult: Secondary | ICD-10-CM | POA: Diagnosis not present

## 2021-07-28 DIAGNOSIS — E559 Vitamin D deficiency, unspecified: Secondary | ICD-10-CM | POA: Diagnosis not present

## 2021-07-28 DIAGNOSIS — Z9189 Other specified personal risk factors, not elsewhere classified: Secondary | ICD-10-CM | POA: Diagnosis not present

## 2021-07-28 DIAGNOSIS — Z8669 Personal history of other diseases of the nervous system and sense organs: Secondary | ICD-10-CM | POA: Diagnosis not present

## 2021-07-29 ENCOUNTER — Other Ambulatory Visit (INDEPENDENT_AMBULATORY_CARE_PROVIDER_SITE_OTHER): Payer: Self-pay | Admitting: Adult Health

## 2021-07-29 ENCOUNTER — Encounter (INDEPENDENT_AMBULATORY_CARE_PROVIDER_SITE_OTHER): Payer: Self-pay | Admitting: Adult Health

## 2021-07-29 LAB — VITAMIN D 25 HYDROXY (VIT D DEFICIENCY, FRACTURES): Vit D, 25-Hydroxy: 56.7 ng/mL (ref 30.0–100.0)

## 2021-08-03 NOTE — Progress Notes (Signed)
Chief Complaint:   OBESITY Kendra Baird is here to discuss her progress with her obesity treatment plan along with follow-up of her obesity related diagnoses. Kendra Baird is on the Category 3 Plan and keeping a food journal and adhering to recommended goals of 1500-1700 calories and 120 grams protein and states she is following her eating plan approximately 75% of the time. Kendra Baird states she is not currently exercising.  Today's visit was #: 27 Starting weight: 222 lbs Starting date: 02/26/2020 Today's weight: 211 lbs Today's date: 07/28/2021 Total lbs lost to date: 11 Total lbs lost since last in-office visit: 0  Interim History: Kendra Baird recently traveled to [place] for 6 days on a family vacation.  She was home for a week, then had a week long vacation at the beach. She reports recent increased stress eating.  Subjective:   1. History of migraine headaches Kendra Baird is on topiramate 50 mg BID. She denies recent history of palpitations. Her last stone was over 10 years ago. Her last migraine was over 6 months ago.  2. Vitamin D deficiency 12/16/2020 Vit D level was 67.7. She is currently taking prescription vitamin D 50,000 IU each week. She denies nausea, vomiting or muscle weakness.   Lab Results  Component Value Date   VD25OH 56.7 07/28/2021   VD25OH 67.7 12/16/2020   VD25OH 19.8 (L) 07/09/2020   3. At risk for side effect of medication Keva is at risk for side effects of medication due to Vit D deficiency/ergocalciferol replacement.  Assessment/Plan:   1. History of migraine headaches Remain well hydrated.  2. Vitamin D deficiency Low Vitamin D level contributes to fatigue and are associated with obesity, breast, and colon cancer. She agrees to continue to take prescription Vitamin D 50,000 IU every week and will follow-up for routine testing of Vitamin D, at least 2-3 times per year to avoid over-replacement. Check labs today and will sen MyChart message with results  tomorrow.  - VITAMIN D 25 Hydroxy (Vit-D Deficiency, Fractures)  3. At risk for side effect of medication Kendra Baird was given approximately 15 minutes of drug side effect counseling today.  We discussed side effect possibility and risk versus benefits. Maryfrances agreed to the medication and will contact this office if these side effects are intolerable.  Repetitive spaced learning was employed today to elicit superior memory formation and behavioral change.   4. Obesity, current BMI 34.1  Cyniah is currently in the action stage of change. As such, her goal is to continue with weight loss efforts. She has agreed to the Hybrid- Category 3 Plan and keeping a food journal and adhering to recommended goals of 1500-1700 calories and 120 grams protein.   Check fasting labs at next OV.  Handouts: High Protein/Low Carb; Protein Content List  Exercise goals:  Increase daily walking.  Behavioral modification strategies: increasing lean protein intake, decreasing simple carbohydrates, keeping healthy foods in the home, ways to avoid boredom eating, and planning for success.  Kendra Baird has agreed to follow-up with our clinic in 3 weeks- fasting. She was informed of the importance of frequent follow-up visits to maximize her success with intensive lifestyle modifications for her multiple health conditions.   Objective:   Blood pressure 106/64, pulse 67, temperature 97.7 F (36.5 C), height 5\' 6"  (1.676 m), weight 211 lb (95.7 kg), SpO2 98 %, currently breastfeeding. Body mass index is 34.06 kg/m.  General: Cooperative, alert, well developed, in no acute distress. HEENT: Conjunctivae and lids unremarkable. Cardiovascular: Regular rhythm.  Lungs: Normal work of breathing. Neurologic: No focal deficits.   Lab Results  Component Value Date   CREATININE 0.75 07/09/2020   BUN 13 07/09/2020   NA 141 07/09/2020   K 4.5 07/09/2020   CL 105 07/09/2020   CO2 24 07/09/2020   Lab Results  Component Value  Date   ALT 12 07/09/2020   AST 12 07/09/2020   ALKPHOS 109 07/09/2020   BILITOT 0.4 07/09/2020   Lab Results  Component Value Date   HGBA1C 5.1 02/26/2020   Lab Results  Component Value Date   INSULIN 2.5 (L) 02/26/2020   Lab Results  Component Value Date   TSH 0.471 04/09/2021   Lab Results  Component Value Date   CHOL 166 02/26/2020   HDL 68 02/26/2020   LDLCALC 89 02/26/2020   TRIG 41 02/26/2020   Lab Results  Component Value Date   VD25OH 56.7 07/28/2021   VD25OH 67.7 12/16/2020   VD25OH 19.8 (L) 07/09/2020   Lab Results  Component Value Date   WBC 7.5 02/26/2020   HGB 14.4 02/26/2020   HCT 41.7 02/26/2020   MCV 88 02/26/2020   PLT 214 02/26/2020   Lab Results  Component Value Date   IRON 87 02/26/2020   TIBC 319 02/26/2020   FERRITIN 71 02/26/2020   Attestation Statements:   Reviewed by clinician on day of visit: allergies, medications, problem list, medical history, surgical history, family history, social history, and previous encounter notes.  Edmund Hilda, CMA, am acting as transcriptionist for William Hamburger, NP.  I have reviewed the above documentation for accuracy and completeness, and I agree with the above. -  Moss Berry d. Takara Sermons, NP-C

## 2021-08-18 DIAGNOSIS — Z01419 Encounter for gynecological examination (general) (routine) without abnormal findings: Secondary | ICD-10-CM | POA: Diagnosis not present

## 2021-08-18 DIAGNOSIS — Z6833 Body mass index (BMI) 33.0-33.9, adult: Secondary | ICD-10-CM | POA: Diagnosis not present

## 2021-08-19 ENCOUNTER — Ambulatory Visit (INDEPENDENT_AMBULATORY_CARE_PROVIDER_SITE_OTHER): Payer: BC Managed Care – PPO | Admitting: Adult Health

## 2021-08-19 ENCOUNTER — Encounter (INDEPENDENT_AMBULATORY_CARE_PROVIDER_SITE_OTHER): Payer: Self-pay | Admitting: Adult Health

## 2021-08-19 ENCOUNTER — Other Ambulatory Visit: Payer: Self-pay

## 2021-08-19 VITALS — BP 110/78 | HR 74 | Temp 98.3°F | Ht 66.0 in | Wt 210.0 lb

## 2021-08-19 DIAGNOSIS — Z8669 Personal history of other diseases of the nervous system and sense organs: Secondary | ICD-10-CM

## 2021-08-19 DIAGNOSIS — E559 Vitamin D deficiency, unspecified: Secondary | ICD-10-CM

## 2021-08-19 DIAGNOSIS — Z6835 Body mass index (BMI) 35.0-35.9, adult: Secondary | ICD-10-CM

## 2021-08-19 DIAGNOSIS — Z9189 Other specified personal risk factors, not elsewhere classified: Secondary | ICD-10-CM

## 2021-08-19 MED ORDER — TOPIRAMATE 50 MG PO TABS: ORAL_TABLET | ORAL | 0 refills | Status: DC

## 2021-08-19 NOTE — Progress Notes (Signed)
Chief Complaint:   OBESITY Kendra Baird is here to discuss her progress with her obesity treatment plan along with follow-up of her obesity related diagnoses. Kendra Baird is on the Category 3 Plan and keeping a food journal and adhering to recommended goals of 1500-1700 calories and 120 grams of protein and states she is following her eating plan approximately 95% of the time. Kendra Baird states she is walking and doing yoga for 60-120 minutes 3 times per week.  Today's visit was #: 28 Starting weight: 222 lbs Starting date: 02/26/2020 Today's weight: 210 lbs Today's date: 08/19/2021 Total lbs lost to date: 12 lbs Total lbs lost since last in-office visit: 1 lb  Interim History: Kendra Baird has responded well with increased protein at meals and high protein snacks after exercise. She remains quite active with walking, yoga, and resistance training. Her husband's new job has really been a great adjustment for the entire family-fantastic! Reviewed body composition with patient.  Subjective:   1. History of migraine headaches She is on topiramate 50 mg twice daily with a significant reduction of migraine severity and frequency.   She will see Dr. Jaffee/Neurologist on 09/02/2021.  2. Vitamin D deficiency Converted to OTC vitamin D 2,000 IU daily.  Lab Results  Component Value Date   VD25OH 56.7 07/28/2021   VD25OH 67.7 12/16/2020   VD25OH 19.8 (L) 07/09/2020   3. At risk for osteoporosis Kendra Baird is at higher risk of osteopenia and osteoporosis due to Vitamin D deficiency.    Assessment/Plan:   1. History of migraine headaches Will refill topiramate 50 mg twice daily today, as per below.  - Refill topiramate (TOPAMAX) 50 MG tablet; 50mg  one in am and one in evening  Dispense: 60 tablet; Refill: 0  2. Vitamin D deficiency Discussed labs with patient today.  Check labs at next office visit.  3. At risk for osteoporosis Kendra Baird was given approximately 15 minutes of osteoporosis prevention  counseling today. Kendra Baird is at risk for osteopenia and osteoporosis due to her Vitamin D deficiency. She was encouraged to take her Vitamin D and follow her higher calcium diet and increase strengthening exercise to help strengthen her bones and decrease her risk of osteopenia and osteoporosis.  Repetitive spaced learning was employed today to elicit superior memory formation and behavioral change.   4. Obesity, current BMI 33.9  Kendra Baird is currently in the action stage of change. As such, her goal is to continue with weight loss efforts. She has agreed to the Category 3 Plan and keeping a food journal and adhering to recommended goals of 1500-1700 calories and 120 grams of protein. Tracking intake and following Category 3.  Exercise goals:  As is.  Behavioral modification strategies: increasing lean protein intake, decreasing simple carbohydrates, meal planning and cooking strategies, keeping healthy foods in the home, and planning for success.  Check fasting labs at next office visit.  Kendra Baird has agreed to follow-up with our clinic in 2-3 weeks, fasting. She was informed of the importance of frequent follow-up visits to maximize her success with intensive lifestyle modifications for her multiple health conditions.   Objective:   Blood pressure 110/78, pulse 74, temperature 98.3 F (36.8 C), height 5\' 6"  (1.676 m), weight 210 lb (95.3 kg), SpO2 97 %, currently breastfeeding. Body mass index is 33.89 kg/m.  General: Cooperative, alert, well developed, in no acute distress. HEENT: Conjunctivae and lids unremarkable. Cardiovascular: Regular rhythm.  Lungs: Normal work of breathing. Neurologic: No focal deficits.   Lab Results  Component Value Date   CREATININE 0.75 07/09/2020   BUN 13 07/09/2020   NA 141 07/09/2020   K 4.5 07/09/2020   CL 105 07/09/2020   CO2 24 07/09/2020   Lab Results  Component Value Date   ALT 12 07/09/2020   AST 12 07/09/2020   ALKPHOS 109 07/09/2020    BILITOT 0.4 07/09/2020   Lab Results  Component Value Date   HGBA1C 5.1 02/26/2020   Lab Results  Component Value Date   INSULIN 2.5 (L) 02/26/2020   Lab Results  Component Value Date   TSH 0.471 04/09/2021   Lab Results  Component Value Date   CHOL 166 02/26/2020   HDL 68 02/26/2020   LDLCALC 89 02/26/2020   TRIG 41 02/26/2020   Lab Results  Component Value Date   VD25OH 56.7 07/28/2021   VD25OH 67.7 12/16/2020   VD25OH 19.8 (L) 07/09/2020   Lab Results  Component Value Date   WBC 7.5 02/26/2020   HGB 14.4 02/26/2020   HCT 41.7 02/26/2020   MCV 88 02/26/2020   PLT 214 02/26/2020   Lab Results  Component Value Date   IRON 87 02/26/2020   TIBC 319 02/26/2020   FERRITIN 71 02/26/2020   Attestation Statements:   Reviewed by clinician on day of visit: allergies, medications, problem list, medical history, surgical history, family history, social history, and previous encounter notes.  I, Insurance claims handler, CMA, am acting as Energy manager for William Hamburger, NP.  I have reviewed the above documentation for accuracy and completeness, and I agree with the above. -  Balin Vandegrift d. Jazara Swiney, NP-C

## 2021-09-01 DIAGNOSIS — E663 Overweight: Secondary | ICD-10-CM | POA: Diagnosis not present

## 2021-09-01 DIAGNOSIS — Z3202 Encounter for pregnancy test, result negative: Secondary | ICD-10-CM | POA: Diagnosis not present

## 2021-09-01 DIAGNOSIS — Z30430 Encounter for insertion of intrauterine contraceptive device: Secondary | ICD-10-CM | POA: Diagnosis not present

## 2021-09-01 NOTE — Progress Notes (Deleted)
NEUROLOGY FOLLOW UP OFFICE NOTE  Kendra Baird [id number]  Assessment/Plan:   Migraine without aura, without status migrainosus, not intractable  Migraine prevention:  *** Migraine rescue:  *** Limit use of pain relievers to no more than 2 days out of week to prevent risk of rebound or medication-overuse headache. Keep headache diary Follow up ***   Subjective:  Kendra Baird is a 30 year old right-handed female who follows up for migraines.   UPDATE: Intensity:  Moderate (rarely severe) Duration:  45 minutes with sumatriptan Frequency:  Once a month to once every other month   Current NSAIDS:  ibuprofen Current analgesics:  Tylenol Current triptans:  none Current ergotamine:  None Current anti-emetic:  None Current muscle relaxants:  None  Current anti-anxiolytic:  None Current sleep aide:  None Current Antihypertensive medications:  None Current Antidepressant medications:  None Current Anticonvulsant medications:  topiramate 50mg  twice daily (taking for weight loss) Current anti-CGRP:  Ubrelvy 100mg  Current Vitamins/Herbal/Supplements:  magnesium gluconate 500mg  twice daily Current Antihistamines/Decongestants:  None Other therapy:  None   Caffeine:  Rarely drinks coffee Diet:  Drinks 1 gallon of water daily.  Drinks little soda. Exercise:  Runs, light free weights Depression:  no; Anxiety:  no Other pain:  no Sleep hygiene:  good    HISTORY: Onset:  She has had migraines since highschool.  She reports a few concussions in highschool basketball.  Severe in college and then improved.   Location:  Right peri-orbital and then radiates over the right side of her head to the shoulder. Quality:  Pressure/non-throbbing Initial intensity:  4-5/10 (8.5/10 debilitating).  She denies new headache, thunderclap headache Aura:  No Prodrome:  No Postdrome:  No Associated symptoms:  Loss of appetite, sometimes nausea, photophobia or phonophobia.  She denies associated  unilateral numbness or weakness. Initial Duration:  All day Initial Frequency:  Usually every other month (debilitating one every 4 months) Initial Frequency of abortive medication: every other month Triggers:  Certain prenatal vitamins, stress, dehydration, menstrual cycle Relieving factors:  Sleep, caffeine, hydration Activity:  No   Headaches improved during her first pregnancy in 2017.  They began to become more frequent about 1 1/2 years after she gave birth to her first son.    On 10/04/2018 she had another typical debilitating migraine.  However, she had passed out, which was different.  She went to the ED for evaluation and treatment of her headache.  She received a headache cocktail of Reglan and Benadryl.  CT of the head without contrast was personally reviewed and was unremarkable.  She hasn't had a migraine since then.     Past NSAIDS:  Ibuprofen, naproxen Past analgesics:  Tylenol, Excedrin Past abortive triptans:  sumatriptan tab (side effects) Past abortive ergotamine:  None Past muscle relaxants:  None Past anti-emetic:  None  Past antihypertensive medications:  None Past antidepressant medications:  None Past anticonvulsant medications:  None Past anti-CGRP:  None Past vitamins/Herbal/Supplements:  None Past antihistamines/decongestants:  None Other past therapies:  None   Family history of headache:  no  PAST MEDICAL HISTORY: Past Medical History:  Diagnosis Date   Anxiety    Back pain    Depression    Fatigue    Headache    Hx of varicella    Joint pain    Kidney problem    Migraine     MEDICATIONS: Current Outpatient Medications on File Prior to Visit  Medication Sig Dispense Refill   Collagen-Vitamin C (COLLAGEN PLUS VITAMIN C PO)  Take 1 tablet by mouth 2 (two) times daily.     INCASSIA 0.35 MG tablet TAKE 1 TABLET BY MOUTH EVERY DAY **TAKE CONTINUOUSLY     Magnesium 400 MG TABS Take 1 tablet by mouth daily.     Prenatal Vit-Fe Fumarate-FA (PRENATAL  MULTIVITAMIN) TABS tablet Take 1 tablet by mouth daily at 12 noon.     SUMAtriptan (IMITREX) 100 MG tablet Take 1 tablet earliest onset of migraine.  May repeat in 2 hours if headache persists or recurs.  Maximum 2 tablets in 24 hours.  Hold breastfeeding for at least 8 to 12 hours after use. 10 tablet 5   topiramate (TOPAMAX) 50 MG tablet 50mg  one in am and one in evening 60 tablet 0   No current facility-administered medications on file prior to visit.    ALLERGIES: No Known Allergies  FAMILY HISTORY: Family History  Problem Relation Age of Onset   Varicose Veins Mother    Cancer Mother    Obesity Mother    Diabetes Father    Cancer Father 54   Hypertension Father    Heart attack Father    CVA Father    Renal Disease Father    Obesity Father    Varicose Veins Brother    Heart disease Paternal Grandfather       Objective:  *** General: No acute distress.  Patient appears ***-groomed.   Head:  Normocephalic/atraumatic Eyes:  Fundi examined but not visualized Neck: supple, no paraspinal tenderness, full range of motion Heart:  Regular rate and rhythm Lungs:  Clear to auscultation bilaterally Back: No paraspinal tenderness Neurological Exam: alert and oriented to person, place, and time.  Speech fluent and not dysarthric, language intact.  CN II-XII intact. Bulk and tone normal, muscle strength 5/5 throughout.  Sensation to light touch intact.  Deep tendon reflexes 2+ throughout, toes downgoing.  Finger to nose testing intact.  Gait normal, Romberg negative.   56, DO  CC: ***

## 2021-09-02 ENCOUNTER — Ambulatory Visit: Payer: BC Managed Care – PPO | Admitting: Neurology

## 2021-09-02 NOTE — Progress Notes (Signed)
Virtual Visit via Video Note The purpose of this virtual visit is to provide medical care while limiting exposure to the novel coronavirus.    Consent was obtained for video visit:  Yes.   Answered questions that patient had about telehealth interaction:  Yes.   I discussed the limitations, risks, security and privacy concerns of performing an evaluation and management service by telemedicine. I also discussed with the patient that there may be a patient responsible charge related to this service. The patient expressed understanding and agreed to proceed.  Pt location: Home Physician Location: office Name of referring provider:  Kennon Rounds, NP I connected with Kendra Baird at patients initiation/request on 09/03/2021 at 10:10 AM EDT by video enabled telemedicine application and verified that I am speaking with the correct person using two identifiers. Pt MRN:  [id number] Pt DOB:  30-May-1991 Video Participants:  Kendra Baird  Assessment and Plan:   Migraine without aura, without status migrainosus, not intractable  Migraine prevention:  topiramate 50mg  twice daily Migraine rescue:  100mg  (sent prescription).  She has sumatriptan if absolutely needed Limit use of pain relievers to no more than 2 days out of week to prevent risk of rebound or medication-overuse headache. Keep headache diary Follow up one year  History of Present Illness:  Kendra Baird is a 30 year old right-handed female who follows up for migraines.   UPDATE: Intensity:  Moderate (rarely severe) Duration:  30 minutes with [name]. When she ran out of samples, she may take 1/2 sumatriptan tablet. Frequency:  Once every other month   Current NSAIDS:  ibuprofen Current analgesics:  Tylenol Current triptans:  sumatriptan tab (side effects) Current ergotamine:  None Current anti-emetic:  None Current muscle relaxants:  None  Current anti-anxiolytic:  None Current sleep aide:  None Current  Antihypertensive medications:  None Current Antidepressant medications:  None Current Anticonvulsant medications:  topiramate 50mg  twice daily (taking for weight loss) Current anti-CGRP:  Ubrelvy 100mg  Current Vitamins/Herbal/Supplements:  magnesium gluconate 500mg  twice daily Current Antihistamines/Decongestants:  None Other therapy:  None   Caffeine:  Rarely drinks coffee Diet:  Drinks 1 gallon of water daily.  Drinks little soda. Exercise:  Runs, light free weights Depression:  no; Anxiety:  no Other pain:  no Sleep hygiene:  good    HISTORY: Onset:  She has had migraines since highschool.  She reports a few concussions in highschool basketball.  Severe in college and then improved.   Location:  Right peri-orbital and then radiates over the right side of her head to the shoulder. Quality:  Pressure/non-throbbing Initial intensity:  4-5/10 (8.5/10 debilitating).  She denies new headache, thunderclap headache Aura:  No Prodrome:  No Postdrome:  No Associated symptoms:  Loss of appetite, sometimes nausea, photophobia or phonophobia.  She denies associated unilateral numbness or weakness. Initial Duration:  All day Initial Frequency:  Usually every other month (debilitating one every 4 months) Initial Frequency of abortive medication: every other month Triggers:  Certain prenatal vitamins, stress, dehydration, menstrual cycle Relieving factors:  Sleep, caffeine, hydration Activity:  No   Headaches improved during her first pregnancy in 2017.  They began to become more frequent about 1 1/2 years after she gave birth to her first son.    On 10/04/2018 she had another typical debilitating migraine.  However, she had passed out, which was different.  She went to the ED for evaluation and treatment of her headache.  She received a headache cocktail of Reglan and Benadryl.  CT  of the head without contrast was personally reviewed and was unremarkable.  She hasn't had a migraine since then.      Past NSAIDS:  Ibuprofen, naproxen Past analgesics:  Tylenol, Excedrin Past abortive triptans:  none Past abortive ergotamine:  None Past muscle relaxants:  None Past anti-emetic:  None  Past antihypertensive medications:  None Past antidepressant medications:  None Past anticonvulsant medications:  None Past anti-CGRP:  None Past vitamins/Herbal/Supplements:  None Past antihistamines/decongestants:  None Other past therapies:  None   Family history of headache:  no  Past Medical History: Past Medical History:  Diagnosis Date   Anxiety    Back pain    Depression    Fatigue    Headache    Hx of varicella    Joint pain    Kidney problem    Migraine     Medications: Outpatient Encounter Medications as of 09/03/2021  Medication Sig   Collagen-Vitamin C (COLLAGEN PLUS VITAMIN C PO) Take 1 tablet by mouth 2 (two) times daily.   INCASSIA 0.35 MG tablet TAKE 1 TABLET BY MOUTH EVERY DAY **TAKE CONTINUOUSLY   Magnesium 400 MG TABS Take 1 tablet by mouth daily.   Prenatal Vit-Fe Fumarate-FA (PRENATAL MULTIVITAMIN) TABS tablet Take 1 tablet by mouth daily at 12 noon.   SUMAtriptan (IMITREX) 100 MG tablet Take 1 tablet earliest onset of migraine.  May repeat in 2 hours if headache persists or recurs.  Maximum 2 tablets in 24 hours.  Hold breastfeeding for at least 8 to 12 hours after use.   topiramate (TOPAMAX) 50 MG tablet 50mg  one in am and one in evening   No facility-administered encounter medications on file as of 09/03/2021.    Allergies: No Known Allergies  Family History: Family History  Problem Relation Age of Onset   Varicose Veins Mother    Cancer Mother    Obesity Mother    Diabetes Father    Cancer Father 58   Hypertension Father    Heart attack Father    CVA Father    Renal Disease Father    Obesity Father    Varicose Veins Brother    Heart disease Paternal Grandfather     Observations/Objective:   No acute distress.  Alert and oriented.  Speech fluent  and not dysarthric.  Language intact.    Follow Up Instructions:    -I discussed the assessment and treatment plan with the patient. The patient was provided an opportunity to ask questions and all were answered. The patient agreed with the plan and demonstrated an understanding of the instructions.   The patient was advised to call back or seek an in-person evaluation if the symptoms worsen or if the condition fails to improve as anticipated.   58, DO

## 2021-09-03 ENCOUNTER — Telehealth (INDEPENDENT_AMBULATORY_CARE_PROVIDER_SITE_OTHER): Payer: BC Managed Care – PPO | Admitting: Neurology

## 2021-09-03 ENCOUNTER — Other Ambulatory Visit: Payer: Self-pay

## 2021-09-03 ENCOUNTER — Encounter: Payer: Self-pay | Admitting: Neurology

## 2021-09-03 DIAGNOSIS — G43009 Migraine without aura, not intractable, without status migrainosus: Secondary | ICD-10-CM | POA: Diagnosis not present

## 2021-09-03 MED ORDER — UBRELVY 100 MG PO TABS: 1.0000 | ORAL_TABLET | ORAL | 5 refills | Status: DC | PRN

## 2021-09-15 ENCOUNTER — Encounter (INDEPENDENT_AMBULATORY_CARE_PROVIDER_SITE_OTHER): Payer: Self-pay | Admitting: Adult Health

## 2021-09-15 ENCOUNTER — Other Ambulatory Visit: Payer: Self-pay

## 2021-09-15 ENCOUNTER — Ambulatory Visit (INDEPENDENT_AMBULATORY_CARE_PROVIDER_SITE_OTHER): Payer: BC Managed Care – PPO | Admitting: Adult Health

## 2021-09-15 VITALS — BP 100/67 | HR 65 | Temp 97.5°F | Ht 66.0 in | Wt 211.0 lb

## 2021-09-15 DIAGNOSIS — E559 Vitamin D deficiency, unspecified: Secondary | ICD-10-CM

## 2021-09-15 DIAGNOSIS — Z9189 Other specified personal risk factors, not elsewhere classified: Secondary | ICD-10-CM | POA: Diagnosis not present

## 2021-09-15 DIAGNOSIS — Z0000 Encounter for general adult medical examination without abnormal findings: Secondary | ICD-10-CM | POA: Diagnosis not present

## 2021-09-15 DIAGNOSIS — Z789 Other specified health status: Secondary | ICD-10-CM | POA: Insufficient documentation

## 2021-09-15 DIAGNOSIS — Z6835 Body mass index (BMI) 35.0-35.9, adult: Secondary | ICD-10-CM

## 2021-09-15 DIAGNOSIS — Z8669 Personal history of other diseases of the nervous system and sense organs: Secondary | ICD-10-CM

## 2021-09-15 MED ORDER — TOPIRAMATE 50 MG PO TABS: ORAL_TABLET | ORAL | 0 refills | Status: DC

## 2021-09-15 NOTE — Progress Notes (Signed)
Chief Complaint:   OBESITY Kendra Baird is here to discuss her progress with her obesity treatment plan along with follow-up of her obesity related diagnoses. Kendra Baird is on the Category 3 Plan and states she is following her eating plan approximately 80% of the time. Kendra Baird states she is doing cardio/weights for 60-120 minutes 5 times per week.  Today's visit was #: 29 Starting weight: 222 lbs Starting date: 02/26/2020 Today's weight: 211 lbs Today's date: 09/15/2021 Total lbs lost to date: 11 lbs Total lbs lost since last in-office visit: 0  Interim History: Kendra Baird will rise very early to exercise before work- dedication!   Her mother has moved in with her family.  Her mother found a home at [facility] - will move to the [facility] on October 10, 2021. Bioimpedance reviewed with patient.  Decreased adipose, increased muscle.  Subjective:   1. Vitamin D deficiency She stopped OTC prenatal vitamin. She is on OTC vitamin D3 5,000 IU daily.  Lab Results  Component Value Date   VD25OH 56.7 07/28/2021   VD25OH 67.7 12/16/2020   VD25OH 19.8 (L) 07/09/2020   2. History of migraine headaches Seen by Neurology, Dr. Everlena Cooper, via MyChart video visit. He discontinued Imitrex and replaced it with [name]. She denies history of nephrolithiasis. She is also on Topirmate 50mg  BID- helps with migraine prevention and reduced food cravings.  3. Uses birth control She stopped . IUD placed - early September - not planning on pregnancy anytime soon.  4. Healthcare maintenance She believes her mother has HLD - unsure if she is on a statin. Her father had HLD - passed away from complications of kidney transplant anti-rejection medications.  Father had T2D, history of MI/CVA in early 70s.  5. At risk for osteoporosis Kendra Baird is at higher risk of osteopenia and osteoporosis due to Vitamin D deficiency.    Assessment/Plan:   1. Vitamin D deficiency Will check vitamin D level today, as per  below.  - VITAMIN D 25 Hydroxy (Vit-D Deficiency, Fractures)  2. History of migraine headaches Refill topiramate 50 mg BID, as per below.  - Refill topiramate (TOPAMAX) 50 MG tablet; 50mg  one in am and one in evening  Dispense: 60 tablet; Refill: 0  3. Uses birth control Follow-up with OB/GYN for recheck.  4. Healthcare maintenance Check lipid panel.   Check A1c and insulin level.  - Comprehensive metabolic panel - Hemoglobin A1c - Insulin, random - Lipid panel  5. At risk for osteoporosis Zarrah was given approximately 15 minutes of osteoporosis prevention counseling today. Kendra Baird is at risk for osteopenia and osteoporosis due to her Vitamin D deficiency. She was encouraged to take her Vitamin D and follow her higher calcium diet and increase strengthening exercise to help strengthen her bones and decrease her risk of osteopenia and osteoporosis.  Repetitive spaced learning was employed today to elicit superior memory formation and behavioral change.   6. Obesity, current BMI 34.1  Kendra Baird is currently in the action stage of change. As such, her goal is to continue with weight loss efforts. She has agreed to the Category 3 Plan and keeping a food journal and adhering to recommended goals of 1500-1700 calories and 120 grams of protein.   Hybrid of journaling and Category 3.  Exercise goals:  As is.  Behavioral modification strategies: increasing lean protein intake, decreasing simple carbohydrates, keeping healthy foods in the home, ways to avoid boredom eating, and planning for success.  Kendra Baird has agreed to follow-up with our  clinic in 3 weeks. She was informed of the importance of frequent follow-up visits to maximize her success with intensive lifestyle modifications for her multiple health conditions.   Kendra Baird was informed we would discuss her lab results at her next visit unless there is a critical issue that needs to be addressed sooner. Kendra Baird agreed to keep her next  visit at the agreed upon time to discuss these results.  Objective:   Blood pressure 100/67, pulse 65, temperature (!) 97.5 F (36.4 C), height 5\' 6"  (1.676 m), weight 211 lb (95.7 kg), SpO2 99 %, currently breastfeeding. Body mass index is 34.06 kg/m.  General: Cooperative, alert, well developed, in no acute distress. HEENT: Conjunctivae and lids unremarkable. Cardiovascular: Regular rhythm.  Lungs: Normal work of breathing. Neurologic: No focal deficits.   Lab Results  Component Value Date   CREATININE 0.75 07/09/2020   BUN 13 07/09/2020   NA 141 07/09/2020   K 4.5 07/09/2020   CL 105 07/09/2020   CO2 24 07/09/2020   Lab Results  Component Value Date   ALT 12 07/09/2020   AST 12 07/09/2020   ALKPHOS 109 07/09/2020   BILITOT 0.4 07/09/2020   Lab Results  Component Value Date   HGBA1C 5.1 02/26/2020   Lab Results  Component Value Date   INSULIN 2.5 (L) 02/26/2020   Lab Results  Component Value Date   TSH 0.471 04/09/2021   Lab Results  Component Value Date   CHOL 166 02/26/2020   HDL 68 02/26/2020   LDLCALC 89 02/26/2020   TRIG 41 02/26/2020   Lab Results  Component Value Date   VD25OH 56.7 07/28/2021   VD25OH 67.7 12/16/2020   VD25OH 19.8 (L) 07/09/2020   Lab Results  Component Value Date   WBC 7.5 02/26/2020   HGB 14.4 02/26/2020   HCT 41.7 02/26/2020   MCV 88 02/26/2020   PLT 214 02/26/2020   Lab Results  Component Value Date   IRON 87 02/26/2020   TIBC 319 02/26/2020   FERRITIN 71 02/26/2020   Attestation Statements:   Reviewed by clinician on day of visit: allergies, medications, problem list, medical history, surgical history, family history, social history, and previous encounter notes.  I, 04/27/2020, CMA, am acting as Insurance claims handler for Energy manager, NP.  I have reviewed the above documentation for accuracy and completeness, and I agree with the above.- Pippa Hanif d. Azlin Zilberman, NP-C

## 2021-09-16 LAB — LIPID PANEL
Chol/HDL Ratio: 2.6 ratio (ref 0.0–4.4)
Cholesterol, Total: 155 mg/dL (ref 100–199)
HDL: 60 mg/dL (ref >39)
LDL Chol Calc (NIH): 84 mg/dL (ref 0–99)
Triglycerides: 52 mg/dL (ref 0–149)
VLDL Cholesterol Cal: 11 mg/dL (ref 5–40)

## 2021-09-16 LAB — COMPREHENSIVE METABOLIC PANEL
ALT: 13 IU/L (ref 0–32)
AST: 15 IU/L (ref 0–40)
Albumin/Globulin Ratio: 2 (ref 1.2–2.2)
Albumin: 4.5 g/dL (ref 3.8–4.8)
Alkaline Phosphatase: 57 IU/L (ref 44–121)
BUN/Creatinine Ratio: 22 (ref 9–23)
BUN: 18 mg/dL (ref 6–20)
Bilirubin Total: 0.5 mg/dL (ref 0.0–1.2)
CO2: 20 mmol/L (ref 20–29)
Calcium: 9.2 mg/dL (ref 8.7–10.2)
Chloride: 105 mmol/L (ref 96–106)
Creatinine, Ser: 0.81 mg/dL (ref 0.57–1.00)
Globulin, Total: 2.2 g/dL (ref 1.5–4.5)
Glucose: 86 mg/dL (ref 65–99)
Potassium: 4.6 mmol/L (ref 3.5–5.2)
Sodium: 140 mmol/L (ref 134–144)
Total Protein: 6.7 g/dL (ref 6.0–8.5)
eGFR: 99 mL/min/{1.73_m2} (ref >59)

## 2021-09-16 LAB — INSULIN, RANDOM: INSULIN: 4.5 u[IU]/mL (ref 2.6–24.9)

## 2021-09-16 LAB — HEMOGLOBIN A1C
Est. average glucose Bld gHb Est-mCnc: 100 mg/dL
Hgb A1c MFr Bld: 5.1 % (ref 4.8–5.6)

## 2021-09-16 LAB — VITAMIN D 25 HYDROXY (VIT D DEFICIENCY, FRACTURES): Vit D, 25-Hydroxy: 52.4 ng/mL (ref 30.0–100.0)

## 2021-10-01 DIAGNOSIS — Z30431 Encounter for routine checking of intrauterine contraceptive device: Secondary | ICD-10-CM | POA: Diagnosis not present

## 2021-10-13 ENCOUNTER — Telehealth (INDEPENDENT_AMBULATORY_CARE_PROVIDER_SITE_OTHER): Payer: BC Managed Care – PPO | Admitting: Adult Health

## 2021-10-13 ENCOUNTER — Other Ambulatory Visit: Payer: Self-pay

## 2021-10-13 DIAGNOSIS — E559 Vitamin D deficiency, unspecified: Secondary | ICD-10-CM

## 2021-10-13 DIAGNOSIS — E66812 Obesity, class 2: Secondary | ICD-10-CM

## 2021-10-13 DIAGNOSIS — Z0000 Encounter for general adult medical examination without abnormal findings: Secondary | ICD-10-CM

## 2021-10-13 DIAGNOSIS — Z8669 Personal history of other diseases of the nervous system and sense organs: Secondary | ICD-10-CM | POA: Diagnosis not present

## 2021-10-13 DIAGNOSIS — Z6835 Body mass index (BMI) 35.0-35.9, adult: Secondary | ICD-10-CM

## 2021-10-13 NOTE — Progress Notes (Signed)
TeleHealth Visit:  Due to the COVID-19 pandemic, this visit was completed with telemedicine (audio/video) technology to reduce patient and provider exposure as well as to preserve personal protective equipment.   Algie has verbally consented to this TeleHealth visit. The patient is located at home, the provider is located at the [facility] office. The participants in this visit include the listed provider and patient. The visit was conducted today via video.  Chief Complaint: OBESITY Kaydynce is here to discuss her progress with her obesity treatment plan along with follow-up of her obesity related diagnoses. Avi is on the Category 3 Plan and keeping a food journal and adhering to recommended goals of 1500-1700 calories and 120 grams protein and states she is following her eating plan approximately 80% of the time. Narya states she is walking 60 minutes 4 times per week.  Today's visit was #: 30 Starting weight: 222 lbs Starting date: 02/26/2020  Interim History: Emogene experienced low grade fever last night (100.2 F)- treated with OTC Acetaminophen. Her son is also experiencing fever and vomiting.  Pt's current temp is 99 F. She has not taken any OTC acetaminophen today.  She feels that she has maintained her weight from last OV.   Subjective:   1. History of migraine headaches Milisa reports a decrease in migraine activity with topiramate 50 mg BID.  She estimates to experience 1 migraine every 6 weeks- previously 1-3 headaches per month.  2. Vitamin D deficiency Discussed labs with patient today. 09/15/2021 Vit D level was 52.4. She is currently taking OTC vitamin D 2,000 IU each day. She denies nausea, vomiting or muscle weakness.   3. Healthcare maintenance Discussed labs with patient today. 09/15/2021 Lipid panel- at goal. Pt is not on statin therapy.  09/15/2021 BG 86, A1c 5.1, and insulin level 4.5- all at goal.  Assessment/Plan:   1. History of migraine  headaches Continue topiramate 50 mg BID, does not need refill today.  2. Vitamin D deficiency Low Vitamin D level contributes to fatigue and are associated with obesity, breast, and colon cancer. She agrees to continue to take OTC Vitamin D 2,000 IU QD and will follow-up for routine testing of Vitamin D, at least 2-3 times per year to avoid over-replacement.  3. Healthcare maintenance Monitor labs.  4. Obesity, current BMI 34.1  Aryka is currently in the action stage of change. As such, her goal is to continue with weight loss efforts. She has agreed to keeping a food journal and adhering to recommended goals of 1500-1700 calories and 120 grams protein.   Exercise goals:  As is  Behavioral modification strategies: increasing lean protein intake, decreasing simple carbohydrates, meal planning and cooking strategies, keeping healthy foods in the home, and planning for success.  Crystin has agreed to follow-up with our clinic in 3 weeks. She was informed of the importance of frequent follow-up visits to maximize her success with intensive lifestyle modifications for her multiple health conditions.  Objective:   VITALS: Per patient if applicable, see vitals. GENERAL: Alert and in no acute distress. CARDIOPULMONARY: No increased WOB. Speaking in clear sentences.  PSYCH: Pleasant and cooperative. Speech normal rate and rhythm. Affect is appropriate. Insight and judgement are appropriate. Attention is focused, linear, and appropriate.  NEURO: Oriented as arrived to appointment on time with no prompting.   Lab Results  Component Value Date   CREATININE 0.81 09/15/2021   BUN 18 09/15/2021   NA 140 09/15/2021   K 4.6 09/15/2021  CL 105 09/15/2021   CO2 20 09/15/2021   Lab Results  Component Value Date   ALT 13 09/15/2021   AST 15 09/15/2021   ALKPHOS 57 09/15/2021   BILITOT 0.5 09/15/2021   Lab Results  Component Value Date   HGBA1C 5.1 09/15/2021   HGBA1C 5.1 02/26/2020   Lab  Results  Component Value Date   INSULIN 4.5 09/15/2021   INSULIN 2.5 (L) 02/26/2020   Lab Results  Component Value Date   TSH 0.471 04/09/2021   Lab Results  Component Value Date   CHOL 155 09/15/2021   HDL 60 09/15/2021   LDLCALC 84 09/15/2021   TRIG 52 09/15/2021   CHOLHDL 2.6 09/15/2021   Lab Results  Component Value Date   VD25OH 52.4 09/15/2021   VD25OH 56.7 07/28/2021   VD25OH 67.7 12/16/2020   Lab Results  Component Value Date   WBC 7.5 02/26/2020   HGB 14.4 02/26/2020   HCT 41.7 02/26/2020   MCV 88 02/26/2020   PLT 214 02/26/2020   Lab Results  Component Value Date   IRON 87 02/26/2020   TIBC 319 02/26/2020   FERRITIN 71 02/26/2020    Attestation Statements:   Reviewed by clinician on day of visit: allergies, medications, problem list, medical history, surgical history, family history, social history, and previous encounter notes.  Time spent on visit including pre-visit chart review and post-visit charting and care was 28 minutes.   Edmund Hilda, CMA, am acting as transcriptionist for William Hamburger, NP.  I have reviewed the above documentation for accuracy and completeness, and I agree with the above. - Bryah [place]ltree d. Zackory Pudlo, NP-C

## 2021-10-29 ENCOUNTER — Ambulatory Visit (INDEPENDENT_AMBULATORY_CARE_PROVIDER_SITE_OTHER): Payer: BC Managed Care – PPO | Admitting: Adult Health

## 2021-11-23 ENCOUNTER — Other Ambulatory Visit: Payer: Self-pay

## 2021-11-23 ENCOUNTER — Encounter (INDEPENDENT_AMBULATORY_CARE_PROVIDER_SITE_OTHER): Payer: Self-pay | Admitting: Adult Health

## 2021-11-23 ENCOUNTER — Ambulatory Visit (INDEPENDENT_AMBULATORY_CARE_PROVIDER_SITE_OTHER): Payer: BC Managed Care – PPO | Admitting: Adult Health

## 2021-11-23 VITALS — BP 111/74 | HR 84 | Temp 98.0°F | Ht 66.0 in | Wt 216.0 lb

## 2021-11-23 DIAGNOSIS — Z8669 Personal history of other diseases of the nervous system and sense organs: Secondary | ICD-10-CM

## 2021-11-23 DIAGNOSIS — Z6835 Body mass index (BMI) 35.0-35.9, adult: Secondary | ICD-10-CM

## 2021-11-23 DIAGNOSIS — Z9189 Other specified personal risk factors, not elsewhere classified: Secondary | ICD-10-CM | POA: Diagnosis not present

## 2021-11-23 MED ORDER — TOPIRAMATE 50 MG PO TABS: ORAL_TABLET | ORAL | 0 refills | Status: DC

## 2021-11-23 NOTE — Progress Notes (Signed)
Chief Complaint:   OBESITY Kendra Baird is here to discuss her progress with her obesity treatment plan along with follow-up of her obesity related diagnoses. Kendra Baird is on the Category 3 Plan and keeping a food journal and adhering to recommended goals of 1500-1700 calories and 120 grams of protein and states she is following her eating plan approximately 75% of the time. Kendra Baird states she is walking/strength training for 45-60 minutes 5 times per week.  Today's visit was #: 31 Starting weight: 222 lbs Starting date: 02/26/2020 Today's weight: 216 lbs Today's date: 11/23/2021 Total lbs lost to date: 6 lbs Total lbs lost since last in-office visit: 5 lbs  Interim History:  Kendra Baird was able to follow journaling and regular exercise when traveling for work. She ans her family recently [place]  Subjective:   1. History of migraine headaches She is on Topamax 50 mg AM/PM. Imitrex 100 mg PRN and Ubrelvy 100 mg for abortive therapy for symptoms. She has not used Imitrex and used Ubrelvy twice since given the prescription [facility]. She denies history of nephrolithiasis.   2. At risk for dehydration Kendra Baird is at risk for dehydration due to limited water intake.  Assessment/Plan:   1. History of migraine headaches Refill Topamax 50 mg BID, dispense 60, 0 refill.  2. At risk for dehydration Altie was given approximately 15 minutes dehydration prevention counseling today. Seleny is at risk for dehydration due to weight loss and current medication(s). She was encouraged to hydrate and monitor fluid status to avoid dehydration as well as weight loss plateaus.   3. Obesity, current BMI 34.9  Kendra Baird is currently in the action stage of change. As such, her goal is to continue with weight loss efforts. She has agreed to the Category 3 Plan with focus of 120 grams of protein.   Check fasting labs at next office visit.  Handouts:  Recipe  II Guide, Eating Out Guide.  Exercise goals:  As is.  Behavioral modification strategies: increasing lean protein intake, decreasing simple carbohydrates, increasing water intake, keeping healthy foods in the home, ways to avoid boredom eating, planning for success, and keeping a strict food journal.  Kendra Baird has agreed to follow-up with our clinic in 2 weeks. She was informed of the importance of frequent follow-up visits to maximize her success with intensive lifestyle modifications for her multiple health conditions.   Objective:   Blood pressure 111/74, pulse 84, temperature 98 F (36.7 C), height 5\' 6"  (1.676 m), weight 216 lb (98 kg), SpO2 98 %, currently breastfeeding. Body mass index is 34.86 kg/m.  General: Cooperative, alert, well developed, in no acute distress. HEENT: Conjunctivae and lids unremarkable. Cardiovascular: Regular rhythm.  Lungs: Normal work of breathing. Neurologic: No focal deficits.   Lab Results  Component Value Date   CREATININE 0.81 09/15/2021   BUN 18 09/15/2021   NA 140 09/15/2021   K 4.6 09/15/2021   CL 105 09/15/2021   CO2 20 09/15/2021   Lab Results  Component Value Date   ALT 13 09/15/2021   AST 15 09/15/2021   ALKPHOS 57 09/15/2021   BILITOT 0.5 09/15/2021   Lab Results  Component Value Date   HGBA1C 5.1 09/15/2021   HGBA1C 5.1 02/26/2020   Lab Results  Component Value Date   INSULIN 4.5 09/15/2021   INSULIN 2.5 (L) 02/26/2020   Lab Results  Component Value Date   TSH 0.471 04/09/2021   Lab Results  Component Value Date   CHOL 155 09/15/2021   HDL 60 09/15/2021   LDLCALC 84 09/15/2021   TRIG 52 09/15/2021   CHOLHDL 2.6 09/15/2021   Lab Results  Component Value Date   VD25OH 52.4 09/15/2021   VD25OH 56.7 07/28/2021   VD25OH 67.7 12/16/2020   Lab Results  Component Value Date   WBC 7.5 02/26/2020   HGB 14.4 02/26/2020   HCT 41.7 02/26/2020   MCV 88 02/26/2020   PLT 214 02/26/2020   Lab Results  Component  Value Date   IRON 87 02/26/2020   TIBC 319 02/26/2020   FERRITIN 71 02/26/2020   Attestation Statements:   Reviewed by clinician on day of visit: allergies, medications, problem list, medical history, surgical history, family history, social history, and previous encounter notes.  I, Insurance claims handler, CMA, am acting as Energy manager for William Hamburger, NP.  I have reviewed the above documentation for accuracy and completeness, and I agree with the above. -  Rashidah [place] d. Kiyoshi Schaab, NP-C

## 2021-12-09 ENCOUNTER — Ambulatory Visit (INDEPENDENT_AMBULATORY_CARE_PROVIDER_SITE_OTHER): Payer: BC Managed Care – PPO | Admitting: Adult Health

## 2021-12-29 ENCOUNTER — Encounter (INDEPENDENT_AMBULATORY_CARE_PROVIDER_SITE_OTHER): Payer: Self-pay | Admitting: Adult Health

## 2021-12-29 ENCOUNTER — Other Ambulatory Visit: Payer: Self-pay

## 2021-12-29 ENCOUNTER — Ambulatory Visit (INDEPENDENT_AMBULATORY_CARE_PROVIDER_SITE_OTHER): Payer: BC Managed Care – PPO | Admitting: Adult Health

## 2021-12-29 VITALS — BP 121/78 | HR 73 | Temp 98.0°F | Ht 66.0 in | Wt 219.0 lb

## 2021-12-29 DIAGNOSIS — Z9189 Other specified personal risk factors, not elsewhere classified: Secondary | ICD-10-CM | POA: Diagnosis not present

## 2021-12-29 DIAGNOSIS — Z6835 Body mass index (BMI) 35.0-35.9, adult: Secondary | ICD-10-CM

## 2021-12-29 DIAGNOSIS — Z8669 Personal history of other diseases of the nervous system and sense organs: Secondary | ICD-10-CM | POA: Diagnosis not present

## 2021-12-29 DIAGNOSIS — E559 Vitamin D deficiency, unspecified: Secondary | ICD-10-CM | POA: Diagnosis not present

## 2021-12-29 MED ORDER — TOPIRAMATE 50 MG PO TABS: ORAL_TABLET | ORAL | 0 refills | Status: DC

## 2021-12-29 NOTE — Progress Notes (Signed)
Chief Complaint:   OBESITY Kendra Baird is here to discuss her progress with her obesity treatment plan along with follow-up of her obesity related diagnoses. Sheryn is on the Category 3 Plan and states she is following her eating plan approximately 75% of the time. Kieley states she is doing cardio for 60 minutes 3 times per week.  Today's visit was #: 38 Starting weight: 222 lbs Starting date: 02/26/2020 Today's weight: 219 lbs Today's date: 12/29/2021 Total lbs lost to date: 3 lbs Total lbs lost since last in-office visit: 0  Interim History:  Jakima says she went to [facility] with immediate/extended family for 7 days to celebrate the New Year. She utilized a Equities trader.   Interval goals:   1)  Exercises 3 x week.   2)  120 grams per day.    Subjective:   1. History of migraine headaches She denies history of nephrolithiasis. She reports needing to use one dose of Ubrelvy while at [facility] for holidays, but believes it was r/t to eating off plan and sleeping less that her usual 7-8 hrs/night.  2. Vitamin D deficiency She is on OTC vitamin D3 2,000 IU daily.  3. At risk for osteoporosis Kassidee is at higher risk of osteopenia and osteoporosis due to Vitamin D deficiency and obesity.    Assessment/Plan:   1. History of migraine headaches Refill topiramate 50 mg BID AM/PM, as per below.  - Refill topiramate (TOPAMAX) 50 MG tablet; 50mg  one in am and one in evening  Dispense: 60 tablet; Refill: 0  2. Vitamin D deficiency Check labs at next office visit.  3. At risk for osteoporosis Keyari was given approximately 15 minutes of osteoporosis prevention counseling today. Jaliah is at risk for osteopenia and osteoporosis due to her Vitamin D deficiency. She was encouraged to take her Vitamin D and follow her higher calcium diet and increase strengthening exercise to help strengthen her bones and decrease her risk of osteopenia and osteoporosis.  Repetitive  spaced learning was employed today to elicit superior memory formation and behavioral change.  4. Obesity, current BMI 35.4  Carmaleta is currently in the action stage of change. As such, her goal is to continue with weight loss efforts. She has agreed to the Category 3 Plan with 120 grams of protein daily.   Check fasting labs at next office visit.  Exercise goals:  As is.  Behavioral modification strategies: increasing lean protein intake, decreasing simple carbohydrates, meal planning and cooking strategies, keeping healthy foods in the home, and planning for success.  Yunique has agreed to follow-up with our clinic in 3 weeks. She was informed of the importance of frequent follow-up visits to maximize her success with intensive lifestyle modifications for her multiple health conditions.   Objective:   Blood pressure 121/78, pulse 73, temperature 98 F (36.7 C), height 5\' 6"  (1.676 m), weight 219 lb (99.3 kg), SpO2 99 %, currently breastfeeding. Body mass index is 35.35 kg/m.  General: Cooperative, alert, well developed, in no acute distress. HEENT: Conjunctivae and lids unremarkable. Cardiovascular: Regular rhythm.  Lungs: Normal work of breathing. Neurologic: No focal deficits.   Lab Results  Component Value Date   CREATININE 0.81 09/15/2021   BUN 18 09/15/2021   NA 140 09/15/2021   K 4.6 09/15/2021   CL 105 09/15/2021   CO2 20 09/15/2021   Lab Results  Component Value Date   ALT 13 09/15/2021   AST 15 09/15/2021  ALKPHOS 57 09/15/2021   BILITOT 0.5 09/15/2021   Lab Results  Component Value Date   HGBA1C 5.1 09/15/2021   HGBA1C 5.1 02/26/2020   Lab Results  Component Value Date   INSULIN 4.5 09/15/2021   INSULIN 2.5 (L) 02/26/2020   Lab Results  Component Value Date   TSH 0.471 04/09/2021   Lab Results  Component Value Date   CHOL 155 09/15/2021   HDL 60 09/15/2021   LDLCALC 84 09/15/2021   TRIG 52 09/15/2021   CHOLHDL 2.6 09/15/2021   Lab Results   Component Value Date   VD25OH 52.4 09/15/2021   VD25OH 56.7 07/28/2021   VD25OH 67.7 12/16/2020   Lab Results  Component Value Date   WBC 7.5 02/26/2020   HGB 14.4 02/26/2020   HCT 41.7 02/26/2020   MCV 88 02/26/2020   PLT 214 02/26/2020   Lab Results  Component Value Date   IRON 87 02/26/2020   TIBC 319 02/26/2020   FERRITIN 71 02/26/2020   Attestation Statements:   Reviewed by clinician on day of visit: allergies, medications, problem list, medical history, surgical history, family history, social history, and previous encounter notes.  I, Water quality scientist, CMA, am acting as Location manager for Mina Marble, NP.  I have reviewed the above documentation for accuracy and completeness, and I agree with the above. -  Tywan Siever d. Lenae Wherley, NP-C

## 2022-01-19 ENCOUNTER — Telehealth (INDEPENDENT_AMBULATORY_CARE_PROVIDER_SITE_OTHER): Payer: BC Managed Care – PPO | Admitting: Adult Health

## 2022-01-19 ENCOUNTER — Other Ambulatory Visit: Payer: Self-pay

## 2022-01-25 ENCOUNTER — Encounter (INDEPENDENT_AMBULATORY_CARE_PROVIDER_SITE_OTHER): Payer: Self-pay

## 2022-01-25 ENCOUNTER — Ambulatory Visit (INDEPENDENT_AMBULATORY_CARE_PROVIDER_SITE_OTHER): Payer: BC Managed Care – PPO | Admitting: Adult Health

## 2022-01-26 ENCOUNTER — Ambulatory Visit (INDEPENDENT_AMBULATORY_CARE_PROVIDER_SITE_OTHER): Payer: BC Managed Care – PPO | Admitting: Adult Health

## 2022-06-07 ENCOUNTER — Telehealth: Payer: BC Managed Care – PPO | Admitting: Physician Assistant

## 2022-06-07 DIAGNOSIS — H109 Unspecified conjunctivitis: Secondary | ICD-10-CM

## 2022-06-07 MED ORDER — OFLOXACIN 0.3 % OP SOLN: 1.0000 [drp] | Freq: Four times a day (QID) | OPHTHALMIC | 0 refills | Status: DC

## 2022-06-07 MED ORDER — POLYMYXIN B-TRIMETHOPRIM 10000-0.1 UNIT/ML-% OP SOLN
1.0000 [drp] | OPHTHALMIC | 0 refills | Status: DC
Start: 2022-06-07 — End: 2023-09-05

## 2022-06-07 NOTE — Addendum Note (Signed)
Addended by: Mar Daring on: 06/07/2022 10:33 AM   Modules accepted: Orders

## 2022-06-07 NOTE — Progress Notes (Signed)

## 2022-08-04 ENCOUNTER — Encounter (INDEPENDENT_AMBULATORY_CARE_PROVIDER_SITE_OTHER): Payer: Self-pay

## 2022-08-31 NOTE — Progress Notes (Signed)
NEUROLOGY FOLLOW UP OFFICE NOTE  Kendra Baird [id number]  Assessment/Plan:   Migraine without aura, without status migrainosus, not intractable   Migraine prevention: Restart topiramate - 25mg  twice daily for one week, then increase to 50mg  twice daily Migraine rescue:  Ubrelvy 100mg   Limit use of pain relievers to no more than 2 days out of week to prevent risk of rebound or medication-overuse headache. Keep headache diary Follow up one year  Subjective:  Kendra Baird is a 31 year old right-handed female who follows up for migraines.   UPDATE: Stopped topiramate in March when she no longer went to [place] [facility].   Notes increase Intensity:  Mild to moderate.  Had a severe one a couple of weeks ago.   Duration:  30 minutes with April Frequency:  once a week   Current NSAIDS:  ibuprofen Current analgesics:  Tylenol Current triptans:  none Current ergotamine:  None Current anti-emetic:  None Current muscle relaxants:  None  Current anti-anxiolytic:  None Current sleep aide:  None Current Antihypertensive medications:  None Current Antidepressant medications:  None Current Anticonvulsant medications: none Current anti-CGRP:  Ubrelvy 100mg  Current Vitamins/Herbal/Supplements:  magnesium gluconate 500mg  twice daily Current Antihistamines/Decongestants:  None Other therapy:  None   Caffeine:  Rarely drinks coffee Diet:  Drinks 1 gallon of water daily.  Drinks little soda. Exercise:  Runs, light free weights Depression:  no; Anxiety:  no Other pain:  no Sleep hygiene:  good    HISTORY: Onset:  She has had migraines since highschool.  She reports a few concussions in highschool basketball.  Severe in college and then improved.   Location:  Right peri-orbital and then radiates over the right side of her head to the shoulder. Quality:  Pressure/non-throbbing Initial intensity:  4-5/10 (8.5/10 debilitating).  She denies new headache, thunderclap  headache Aura:  No Prodrome:  No Postdrome:  No Associated symptoms:  Loss of appetite, sometimes nausea, photophobia or phonophobia.  She denies associated unilateral numbness or weakness. Initial Duration:  All day Initial Frequency:  Usually every other month (debilitating one every 4 months) Initial Frequency of abortive medication: every other month Triggers:  Certain prenatal vitamins, stress, dehydration, menstrual cycle Relieving factors:  Sleep, caffeine, hydration Activity:  No   Headaches improved during her first pregnancy in 2017.  They began to become more frequent about 1 1/2 years after she gave birth to her first son.    On 10/04/2018 she had another typical debilitating migraine.  However, she had passed out, which was different.  She went to the ED for evaluation and treatment of her headache.  She received a headache cocktail of Reglan and Benadryl.  CT of the head without contrast was personally reviewed and was unremarkable.  She hasn't had a migraine since then.     Past NSAIDS:  Ibuprofen, naproxen Past analgesics:  Tylenol, Excedrin Past abortive triptans:   Past abortive ergotamine:  None Past muscle relaxants:  None Past anti-emetic:  None  Past antihypertensive medications:  None Past antidepressant medications:  None Past anticonvulsant medications:  None Past anti-CGRP:  None Past vitamins/Herbal/Supplements:  None Past antihistamines/decongestants:  None Other past therapies:  None   Family history of headache:  no  PAST MEDICAL HISTORY: Past Medical History:  Diagnosis Date   Anxiety    Back pain    Depression    Fatigue    Headache    Hx of varicella    Joint pain    Kidney problem  Migraine     MEDICATIONS: Current Outpatient Medications on File Prior to Visit  Medication Sig Dispense Refill   Collagen-Vitamin C (COLLAGEN PLUS VITAMIN C PO) Take 1 tablet by mouth 2 (two) times daily.     Magnesium 400 MG TABS Take 1 tablet by mouth  daily.     ofloxacin (OCUFLOX) 0.3 % ophthalmic solution Place 1 drop into the right eye 4 (four) times daily. X 5 days 5 mL 0   SUMAtriptan (IMITREX) 100 MG tablet Take 1 tablet earliest onset of migraine.  May repeat in 2 hours if headache persists or recurs.  Maximum 2 tablets in 24 hours.  Hold breastfeeding for at least 8 to 12 hours after use. 10 tablet 5   topiramate (TOPAMAX) 50 MG tablet 50mg  one in am and one in evening 60 tablet 0   trimethoprim-polymyxin b (POLYTRIM) ophthalmic solution Place 1 drop into the right eye every 4 (four) hours. X 5 days 10 mL 0   Ubrogepant (UBRELVY) 100 MG TABS Take 1 tablet by mouth as needed (May repeat in 2 hours.  Maximum 2 tablets in 24 hours.). 16 tablet 5   No current facility-administered medications on file prior to visit.    ALLERGIES: No Known Allergies  FAMILY HISTORY: Family History  Problem Relation Age of Onset   Varicose Veins Mother    Cancer Mother    Obesity Mother    Diabetes Father    Cancer Father 60   Hypertension Father    Heart attack Father    CVA Father    Renal Disease Father    Obesity Father    Varicose Veins Brother    Heart disease Paternal Grandfather       Objective:  Blood pressure (!) 134/90, pulse 81, height 5\' 6"  (1.676 m), weight 230 lb 6.4 oz (104.5 kg), SpO2 97 %, currently breastfeeding. General: No acute distress.  Patient appears well-groomed.   Head:  Normocephalic/atraumatic Eyes:  Fundi examined but not visualized Neck: supple, no paraspinal tenderness, full range of motion Heart:  Regular rate and rhythm Lungs:  Clear to auscultation bilaterally Back: No paraspinal tenderness Neurological Exam: alert and oriented to person, place, and time.  Speech fluent and not dysarthric, language intact.  CN II-XII intact. Bulk and tone normal, muscle strength 5/5 throughout.  Sensation to light touch intact.  Deep tendon reflexes 2+ throughout, toes downgoing.  Finger to nose testing intact.  Gait  normal, Romberg negative.   58, DO  CC: , NP

## 2022-09-03 ENCOUNTER — Ambulatory Visit (INDEPENDENT_AMBULATORY_CARE_PROVIDER_SITE_OTHER): Payer: BC Managed Care – PPO | Admitting: Neurology

## 2022-09-03 ENCOUNTER — Encounter: Payer: Self-pay | Admitting: Neurology

## 2022-09-03 VITALS — BP 134/90 | HR 81 | Ht 66.0 in | Wt 230.4 lb

## 2022-09-03 DIAGNOSIS — G43009 Migraine without aura, not intractable, without status migrainosus: Secondary | ICD-10-CM

## 2022-09-03 MED ORDER — UBRELVY 100 MG PO TABS: 1.0000 | ORAL_TABLET | ORAL | 5 refills | Status: DC | PRN

## 2022-09-03 MED ORDER — TOPIRAMATE 50 MG PO TABS: 50.0000 mg | ORAL_TABLET | Freq: Two times a day (BID) | ORAL | 11 refills | Status: DC

## 2022-09-03 NOTE — Patient Instructions (Signed)
Restart topiramate - take 1/2 tablet twice daily for one week, then increase to 1 tablet twice daily Ubrelvy as needed Follow up one year

## 2022-09-08 ENCOUNTER — Telehealth: Payer: Self-pay | Admitting: Pharmacy Technician

## 2022-09-08 ENCOUNTER — Other Ambulatory Visit (HOSPITAL_COMMUNITY): Payer: Self-pay

## 2022-09-08 NOTE — Telephone Encounter (Signed)
Patient Advocate Encounter  Prior Authorization for [name] 100MG  tablets has been approved.    PA# Key: [id number] Effective dates: 09/08/2022 through 0912/2024      12/2023, CPhT Pharmacy Patient Advocate Specialist [facility] Patient Advocate Team Direct Number: [phone]  Fax: [phone]

## 2023-06-02 DIAGNOSIS — Z01419 Encounter for gynecological examination (general) (routine) without abnormal findings: Secondary | ICD-10-CM | POA: Diagnosis not present

## 2023-06-02 DIAGNOSIS — Z124 Encounter for screening for malignant neoplasm of cervix: Secondary | ICD-10-CM | POA: Diagnosis not present

## 2023-08-12 ENCOUNTER — Other Ambulatory Visit (HOSPITAL_COMMUNITY): Payer: Self-pay

## 2023-08-12 ENCOUNTER — Telehealth: Payer: Self-pay | Admitting: Pharmacy Technician

## 2023-08-12 NOTE — Telephone Encounter (Signed)
Pharmacy Patient Advocate Encounter   Received notification from CoverMyMeds that prior authorization for UBRELVY 100MG  is required/requested.   Insurance verification completed.   The patient is insured through [employer] .   Per test claim: PA required; PA submitted to [employer] via CoverMyMeds Key/confirmation #/EOC [id number] Status is pending

## 2023-08-12 NOTE — Telephone Encounter (Signed)
Pharmacy Patient Advocate Encounter  Received notification from [employer] that Prior Authorization for UBRELVY 100MG  has been APPROVED from 7.17.24 to 8.15.25 with QUANTITY LIMIT of 10 for a 30 day supply.   PA #/Case ID/Reference #: [id number]

## 2023-09-02 NOTE — Progress Notes (Unsigned)
NEUROLOGY FOLLOW UP OFFICE NOTE  Kendra Baird [id number]  Assessment/Plan:   Migraine without aura, without status migrainosus, not intractable Right sided cervicalgia with scoliosis   Due to ongoing neck pain, will refer to [facility] for OMM or other potential treatment. Migraine prevention: Topiramate 50mg  twice daily  Migraine rescue:  Ubrelvy 100mg   Limit use of pain relievers to no more than 2 days out of week to prevent risk of rebound or medication-overuse headache. Keep headache diary Follow up one year or sooner if needed.  Subjective:  Kendra Baird is a 32 year old right-handed female who follows up for migraines.   UPDATE: Overall, migraines have been controlled. Intensity:  Mild to moderate.  Had a severe one a couple of weeks ago.   Duration:  30 minutes with [name]  Frequency:  once a week   She woke up a week ago with right sided neck pain and has had a chronic headache for the past week.  Trying to do home neck stretches.  [name] has somewhat helped.  It is overall improved.  She has more range of motion and really only feels the tension when neck turned to right.  No radicular pain or numbness into the arm.  She wakes up with neck pain about every couple of months.  Neck pain has flared up after stopping to see the chiropractor for her scoliosis.   She stopped due to expense.     Current NSAIDS:  ibuprofen Current analgesics:  Tylenol Current triptans:  none Current ergotamine:  None Current anti-emetic:  None Current muscle relaxants:  None  Current anti-anxiolytic:  None Current sleep aide:  None Current Antihypertensive medications:  None Current Antidepressant medications:  None Current Anticonvulsant medications: none Current anti-CGRP:  Ubrelvy 100mg  Current Vitamins/Herbal/Supplements:  magnesium gluconate 500mg  twice daily Current Antihistamines/Decongestants:  None Other therapy:  None   Caffeine:  Rarely drinks coffee Diet:   Drinks 1 gallon of water daily.  Drinks little soda. Exercise:  Runs, light free weights Depression:  no; Anxiety:  no Other pain:  no Sleep hygiene:  good    HISTORY: Onset:  She has had migraines since highschool.  She reports a few concussions in highschool basketball.  Severe in college and then improved.   Location:  Right peri-orbital and then radiates over the right side of her head to the shoulder. Quality:  Pressure/non-throbbing Initial intensity:  4-5/10 (8.5/10 debilitating).  She denies new headache, thunderclap headache Aura:  No Prodrome:  No Postdrome:  No Associated symptoms:  Loss of appetite, sometimes nausea, photophobia or phonophobia.  She denies associated unilateral numbness or weakness. Initial Duration:  All day Initial Frequency:  Usually every other month (debilitating one every 4 months) Initial Frequency of abortive medication: every other month Triggers:  Certain prenatal vitamins, stress, dehydration, menstrual cycle Relieving factors:  Sleep, caffeine, hydration Activity:  No   Headaches improved during her first pregnancy in 2017.  They began to become more frequent about 1 1/2 years after she gave birth to her first son.    On 10/04/2018 she had another typical debilitating migraine.  However, she had passed out, which was different.  She went to the ED for evaluation and treatment of her headache.  She received a headache cocktail of Reglan and Benadryl.  CT of the head without contrast was personally reviewed and was unremarkable.  She hasn't had a migraine since then.     Past NSAIDS:  Ibuprofen, naproxen Past analgesics:  Tylenol, Excedrin  Past abortive triptans:   Past abortive ergotamine:  None Past muscle relaxants:  None Past anti-emetic:  None  Past antihypertensive medications:  None Past antidepressant medications:  None Past anticonvulsant medications:  None Past anti-CGRP:  None Past vitamins/Herbal/Supplements:  None Past  antihistamines/decongestants:  None Other past therapies:  chiropractic (neck/spine pain/scoliosis)   Family history of headache:  no  PAST MEDICAL HISTORY: Past Medical History:  Diagnosis Date   Anxiety    Back pain    Depression    Fatigue    Headache    Hx of varicella    Joint pain    Kidney problem    Migraine     MEDICATIONS: Current Outpatient Medications on File Prior to Visit  Medication Sig Dispense Refill   Collagen-Vitamin C (COLLAGEN PLUS VITAMIN C PO) Take 1 tablet by mouth 2 (two) times daily.     Magnesium 400 MG TABS Take 1 tablet by mouth daily.     ofloxacin (OCUFLOX) 0.3 % ophthalmic solution Place 1 drop into the right eye 4 (four) times daily. X 5 days (Patient not taking: Reported on 09/03/2022) 5 mL 0   SUMAtriptan (IMITREX) 100 MG tablet Take 1 tablet earliest onset of migraine.  May repeat in 2 hours if headache persists or recurs.  Maximum 2 tablets in 24 hours.  Hold breastfeeding for at least 8 to 12 hours after use. (Patient not taking: Reported on 09/03/2022) 10 tablet 5   topiramate (TOPAMAX) 50 MG tablet Take 1 tablet (50 mg total) by mouth 2 (two) times daily. 60 tablet 11   trimethoprim-polymyxin b (POLYTRIM) ophthalmic solution Place 1 drop into the right eye every 4 (four) hours. X 5 days (Patient not taking: Reported on 09/03/2022) 10 mL 0   Ubrogepant (UBRELVY) 100 MG TABS Take 1 tablet by mouth as needed (May repeat in 2 hours.  Maximum 2 tablets in 24 hours.). 16 tablet 5   Vitamin D, Ergocalciferol, (DRISDOL) 1.25 MG (50000 UNIT) CAPS capsule Take 50,000 Units by mouth every 7 (seven) days.     No current facility-administered medications on file prior to visit.    ALLERGIES: No Known Allergies  FAMILY HISTORY: Family History  Problem Relation Age of Onset   Varicose Veins Mother    Cancer Mother    Obesity Mother    Diabetes Father    Cancer Father 2   Hypertension Father    Heart attack Father    CVA Father    Renal Disease  Father    Obesity Father    Varicose Veins Brother    Heart disease Paternal Grandfather       Objective:  Blood pressure 114/86, pulse 65, height 5\' 6"  (1.676 m), weight 239 lb 12.8 oz (108.8 kg), SpO2 100% General: No acute distress.  Patient appears well-groomed.   Head:  Normocephalic/atraumatic Eyes:  Fundi examined but not visualized Neck: supple, right sided paraspinal tenderness, slightly reduced range of motion to right neck turn Heart:  Regular rate and rhythm Neurological Exam: alert and oriented.  Speech fluent and not dysarthric, language intact.  CN II-XII intact. Bulk and tone normal, muscle strength 5/5 throughout.  Sensation to light touch intact.  Deep tendon reflexes 2+ throughout.  Finger to nose testing intact.  Gait normal   Shon Millet, DO  CC: Ladoris Gene, NP

## 2023-09-05 ENCOUNTER — Ambulatory Visit (INDEPENDENT_AMBULATORY_CARE_PROVIDER_SITE_OTHER): Payer: BC Managed Care – PPO | Admitting: Neurology

## 2023-09-05 ENCOUNTER — Encounter: Payer: Self-pay | Admitting: Neurology

## 2023-09-05 VITALS — BP 114/86 | HR 65 | Ht 66.0 in | Wt 239.8 lb

## 2023-09-05 DIAGNOSIS — M542 Cervicalgia: Secondary | ICD-10-CM

## 2023-09-05 DIAGNOSIS — G43009 Migraine without aura, not intractable, without status migrainosus: Secondary | ICD-10-CM

## 2023-09-05 MED ORDER — UBRELVY 100 MG PO TABS: 1.0000 | ORAL_TABLET | ORAL | 11 refills | Status: AC | PRN

## 2023-09-05 MED ORDER — TOPIRAMATE 50 MG PO TABS: 50.0000 mg | ORAL_TABLET | Freq: Two times a day (BID) | ORAL | 11 refills | Status: AC

## 2023-09-05 NOTE — Patient Instructions (Signed)
Continue topiramate and Ubrelvy Refer to [facility] for OMM with either Dr. Antoine Primas or Dr. Richardean Sale Limit use of pain relievers to no more than 2 days out of week to prevent risk of rebound or medication-overuse headache. Follow up one year

## 2024-07-29 DIAGNOSIS — M545 Low back pain, unspecified: Secondary | ICD-10-CM | POA: Diagnosis not present

## 2024-09-03 NOTE — Progress Notes (Deleted)
 NEUROLOGY FOLLOW UP OFFICE NOTE  Kendra Baird [id number]  Assessment/Plan:   Migraine without aura, without status migrainosus, not intractable    Migraine prevention: Topiramate  50mg  twice daily  Migraine rescue:  Ubrelvy  100mg   Limit use of pain relievers to no more than 9 days out of the month to prevent risk of rebound or medication-overuse headache. Keep headache diary Follow up one year or sooner if needed.  Subjective:  Kendra Baird is a 33 year old right-handed female who follows up for migraines.   UPDATE: Overall, migraines have been controlled. Intensity:  Mild to moderate.  Had a severe one a couple of weeks ago.   Duration:  30 minutes with Ubrelvy   Frequency:  once a week     Current NSAIDS:  ibuprofen  Current analgesics:  Tylenol  Current triptans:  none Current ergotamine:  None Current anti-emetic:  None Current muscle relaxants:  None  Current anti-anxiolytic:  None Current sleep aide:  None Current Antihypertensive medications:  None Current Antidepressant medications:  None Current Anticonvulsant medications: none Current anti-CGRP:  Ubrelvy  100mg  Current Vitamins/Herbal/Supplements:  magnesium gluconate 500mg  twice daily Current Antihistamines/Decongestants:  None Other therapy:  None   Caffeine:  Rarely drinks coffee Diet:  Drinks 1 gallon of water daily.  Drinks little soda. Exercise:  Runs, light free weights Depression:  no; Anxiety:  no Other pain:  no Sleep hygiene:  good    HISTORY: Headaches: Onset:  She has had migraines since highschool.  She reports a few concussions in highschool basketball.  Severe in college and then improved. Location:  Right peri-orbital and then radiates over the right side of her head to the shoulder. Quality:  Pressure/non-throbbing Initial intensity:  4-5/10 (8.5/10 debilitating).  She denies new headache, thunderclap headache Aura:  No Prodrome:  No Postdrome:  No Associated symptoms:  Loss of  appetite, sometimes nausea, photophobia or phonophobia.  She denies associated unilateral numbness or weakness. Initial Duration:  All day Initial Frequency:  Usually every other month (debilitating one every 4 months) Initial Frequency of abortive medication: every other month Triggers:  Certain prenatal vitamins, stress, dehydration, menstrual cycle Relieving factors:  Sleep, caffeine, hydration Activity:  No   Headaches improved during her first pregnancy in 2017.  They began to become more frequent about 1 1/2 years after she gave birth to her first son.    On 10/04/2018 she had another typical debilitating migraine.  However, she had passed out, which was different.  She went to the ED for evaluation and treatment of her headache.  She received a headache cocktail of Reglan  and Benadryl .  CT of the head without contrast was personally reviewed and was unremarkable.    Cervicalgia with scoliosis: In September 2024, she woke up with right sided neck pain and has had a chronic headache for the past week.  Trying to do home neck stretches.  Ubrelvy  has somewhat helped.  It is overall improved.  She has more range of motion and really only feels the tension when neck turned to right.  No radicular pain or numbness into the arm.  She wakes up with neck pain about every couple of months.  Neck pain has flared up after stopping to see the chiropractor for her scoliosis.   Past medication:  Past NSAIDS:  Ibuprofen , naproxen Past analgesics:  Tylenol , Excedrin Past abortive triptans:   Past abortive ergotamine:  None Past muscle relaxants:  None Past anti-emetic:  None  Past antihypertensive medications:  None Past antidepressant medications:  None Past  anticonvulsant medications:  None Past anti-CGRP:  None Past vitamins/Herbal/Supplements:  None Past antihistamines/decongestants:  None Other past therapies:  chiropractic (neck/spine pain/scoliosis)   Family history of headache:  no  PAST  MEDICAL HISTORY: Past Medical History:  Diagnosis Date   Anxiety    Back pain    Depression    Fatigue    Headache    Hx of varicella    Joint pain    Kidney problem    Migraine     MEDICATIONS: Current Outpatient Medications on File Prior to Visit  Medication Sig Dispense Refill   Magnesium 400 MG TABS Take 1 tablet by mouth daily.     topiramate  (TOPAMAX ) 50 MG tablet Take 1 tablet (50 mg total) by mouth 2 (two) times daily. 60 tablet 11   Ubrogepant  (UBRELVY ) 100 MG TABS Take 1 tablet (100 mg total) by mouth as needed (May repeat in 2 hours.  Maximum 2 tablets in 24 hours.). 16 tablet 11   Vitamin D , Ergocalciferol , (DRISDOL ) 1.25 MG (50000 UNIT) CAPS capsule Take 50,000 Units by mouth every 7 (seven) days.     No current facility-administered medications on file prior to visit.    ALLERGIES: No Known Allergies  FAMILY HISTORY: Family History  Problem Relation Age of Onset   Varicose Veins Mother    Cancer Mother    Obesity Mother    Diabetes Father    Cancer Father 20   Hypertension Father    Heart attack Father    CVA Father    Renal Disease Father    Obesity Father    Varicose Veins Brother    Heart disease Paternal Grandfather       Objective:  *** General: No acute distress.  Patient appears well-groomed.   Head:  Normocephalic/atraumatic Neck:  Supple.  No paraspinal tenderness.  Full range of motion. Heart:  Regular rate and rhythm. Neuro:  Alert and oriented.  Speech fluent and not dysarthric.  Language intact.  CN II-XII intact.  Bulk and tone normal.  Muscle strength 5/5 throughout.  Sensation to light touch intact.  Deep tendon reflexes 2+ throughout, toes downgoing.  Gait normal.  Romberg negative.    Juliene Dunnings, DO  CC: Katheryn Canny, NP

## 2024-09-04 ENCOUNTER — Ambulatory Visit: Payer: BC Managed Care – PPO | Admitting: Neurology

## 2025-02-13 ENCOUNTER — Ambulatory Visit: Admitting: Neurology
